# Patient Record
Sex: Female | Born: 1950 | Race: Asian | Hispanic: No | Marital: Married | Smoking: Never smoker
Health system: Southern US, Community
[De-identification: ages and names within clinical notes are randomized; demographics above are authoritative.]

## PROBLEM LIST (undated history)

## (undated) DIAGNOSIS — Z789 Other specified health status: Secondary | ICD-10-CM

## (undated) HISTORY — DX: Other specified health status: Z78.9

## (undated) HISTORY — PX: OTHER SURGICAL HISTORY: SHX169

---

## 2015-04-18 ENCOUNTER — Other Ambulatory Visit: Payer: Self-pay | Admitting: Physician Assistant

## 2015-04-18 ENCOUNTER — Ambulatory Visit
Admission: RE | Admit: 2015-04-18 | Discharge: 2015-04-18 | Disposition: A | Discharge status: Home / Self Care | Payer: BLUE CROSS/BLUE SHIELD | Source: Ambulatory Visit | Attending: Physician Assistant | Admitting: Physician Assistant

## 2015-04-18 DIAGNOSIS — M25562 Pain in left knee: Principal | ICD-10-CM

## 2015-04-18 DIAGNOSIS — M25571 Pain in right ankle and joints of right foot: Secondary | ICD-10-CM | POA: Diagnosis not present

## 2015-04-18 DIAGNOSIS — M25561 Pain in right knee: Secondary | ICD-10-CM

## 2015-04-18 DIAGNOSIS — M25572 Pain in left ankle and joints of left foot: Secondary | ICD-10-CM

## 2015-04-18 DIAGNOSIS — M8580 Other specified disorders of bone density and structure, unspecified site: Secondary | ICD-10-CM | POA: Insufficient documentation

## 2015-04-18 DIAGNOSIS — X58XXXA Exposure to other specified factors, initial encounter: Secondary | ICD-10-CM | POA: Insufficient documentation

## 2015-04-18 DIAGNOSIS — M7989 Other specified soft tissue disorders: Secondary | ICD-10-CM | POA: Diagnosis not present

## 2015-04-18 DIAGNOSIS — S8265XA Nondisplaced fracture of lateral malleolus of left fibula, initial encounter for closed fracture: Secondary | ICD-10-CM | POA: Diagnosis not present

## 2015-04-18 DIAGNOSIS — S8255XA Nondisplaced fracture of medial malleolus of left tibia, initial encounter for closed fracture: Secondary | ICD-10-CM | POA: Diagnosis not present

## 2017-05-28 IMAGING — CR DG ANKLE COMPLETE 3+V*L*
1 series · 3 of 3 positions shown · non-contrast
Comparison: No prior.

CLINICAL DATA: Knee pain.  No reported injury .

EXAM:
LEFT ANKLE COMPLETE - 3+ VIEW

[Series 1: dg ankle complete left · 0.14mm/px · 3 of 3 slices shown]
[im 1/3]
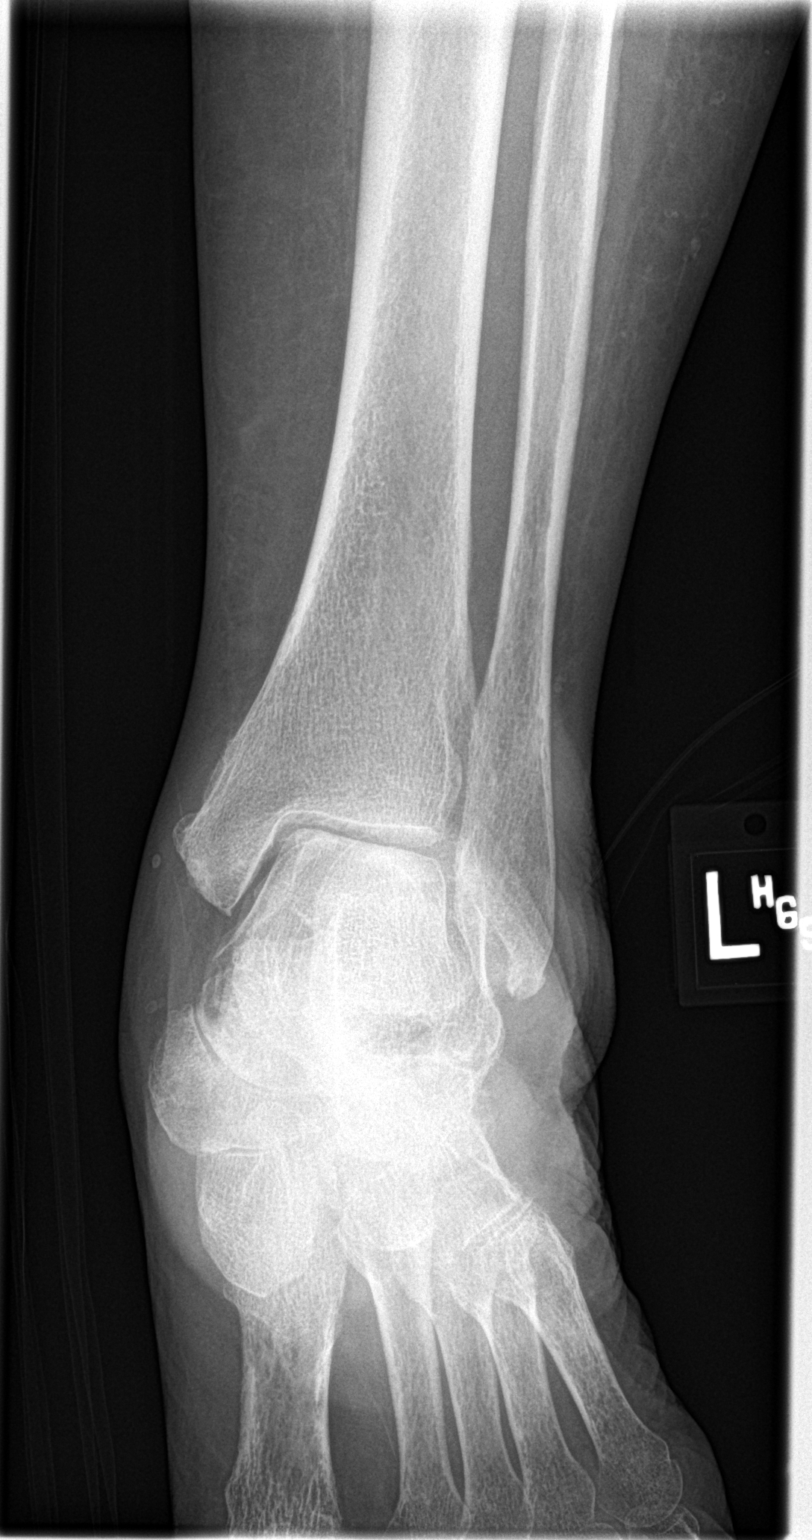
[im 2/3]
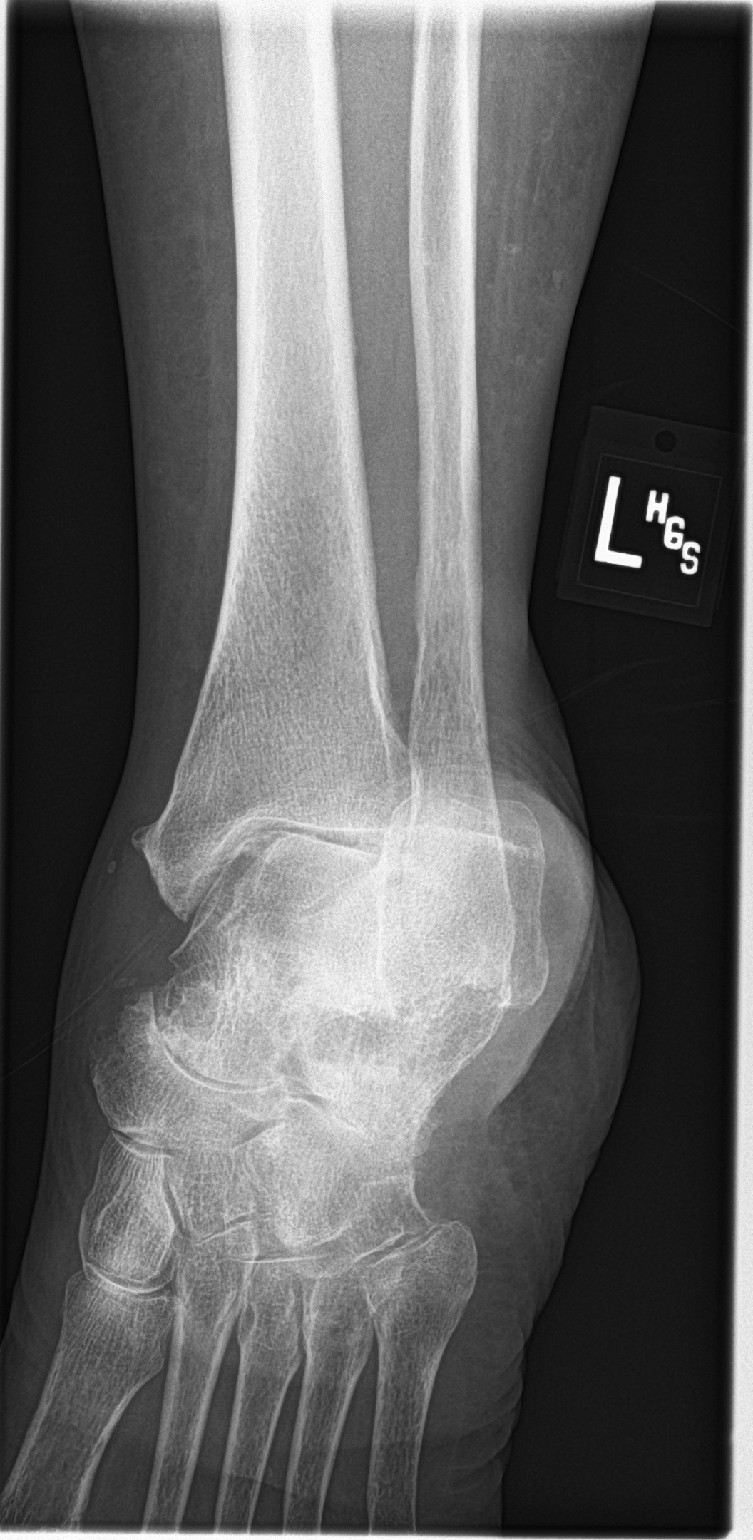
[im 3/3]
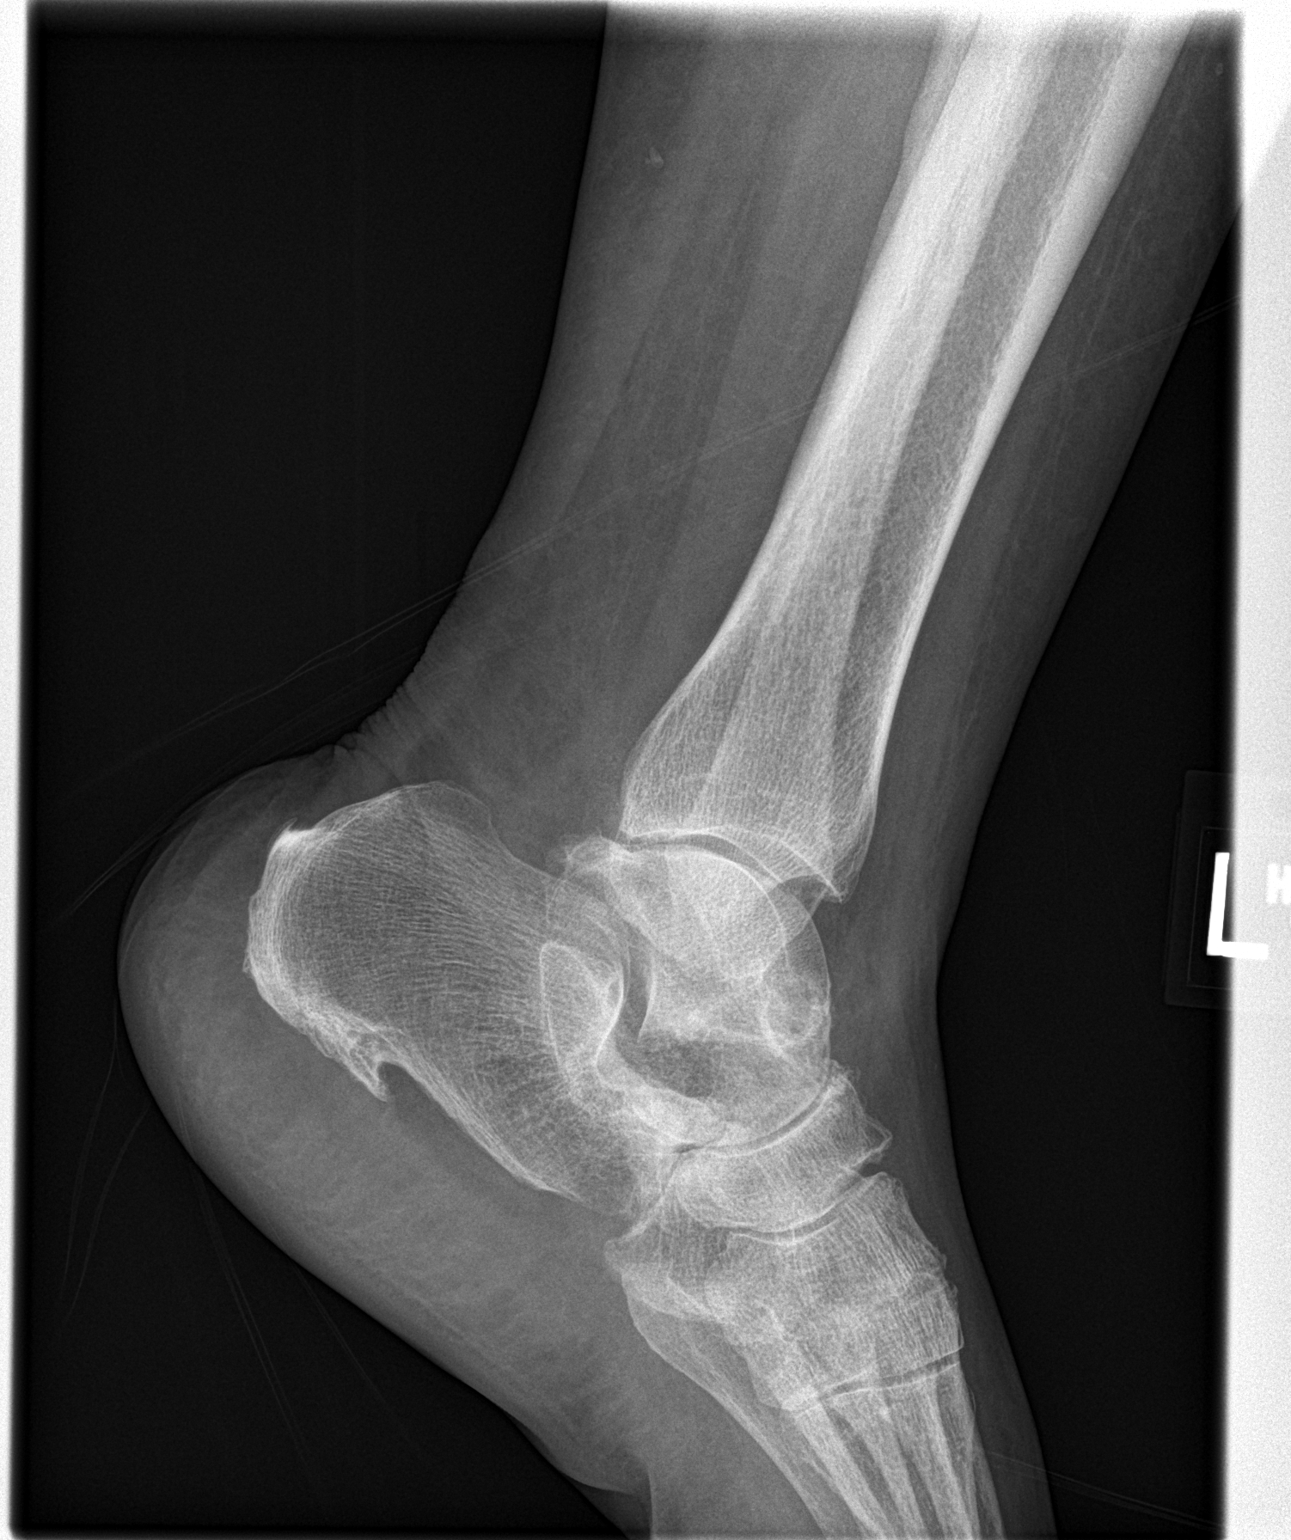

[3 of 3 positions shown; findings below may reference images not displayed]

FINDINGS: Diffuse soft tissue swelling. Diffuse osteopenia degenerative
change. Subtle nondisplaced small avulsion fractures from the distal
tips of these medial and lateral malleoli. Age undetermined. Venous
calcifications noted.
IMPRESSION: Diffuse osteopenia degenerative change. Nondisplaced tiny avulsion
fractures from the distal tips of the medial and lateral malleoli.
Age undetermined.

## 2017-05-28 IMAGING — CR DG ANKLE COMPLETE 3+V*R*
1 series · 3 of 3 positions shown · non-contrast
Comparison: None.

CLINICAL DATA: Bilateral ankle pain for 2 years

EXAM:
RIGHT ANKLE - COMPLETE 3+ VIEW

[Series 1: dg ankle complete right · 0.14mm/px · 3 of 3 slices shown]
[im 1/3]
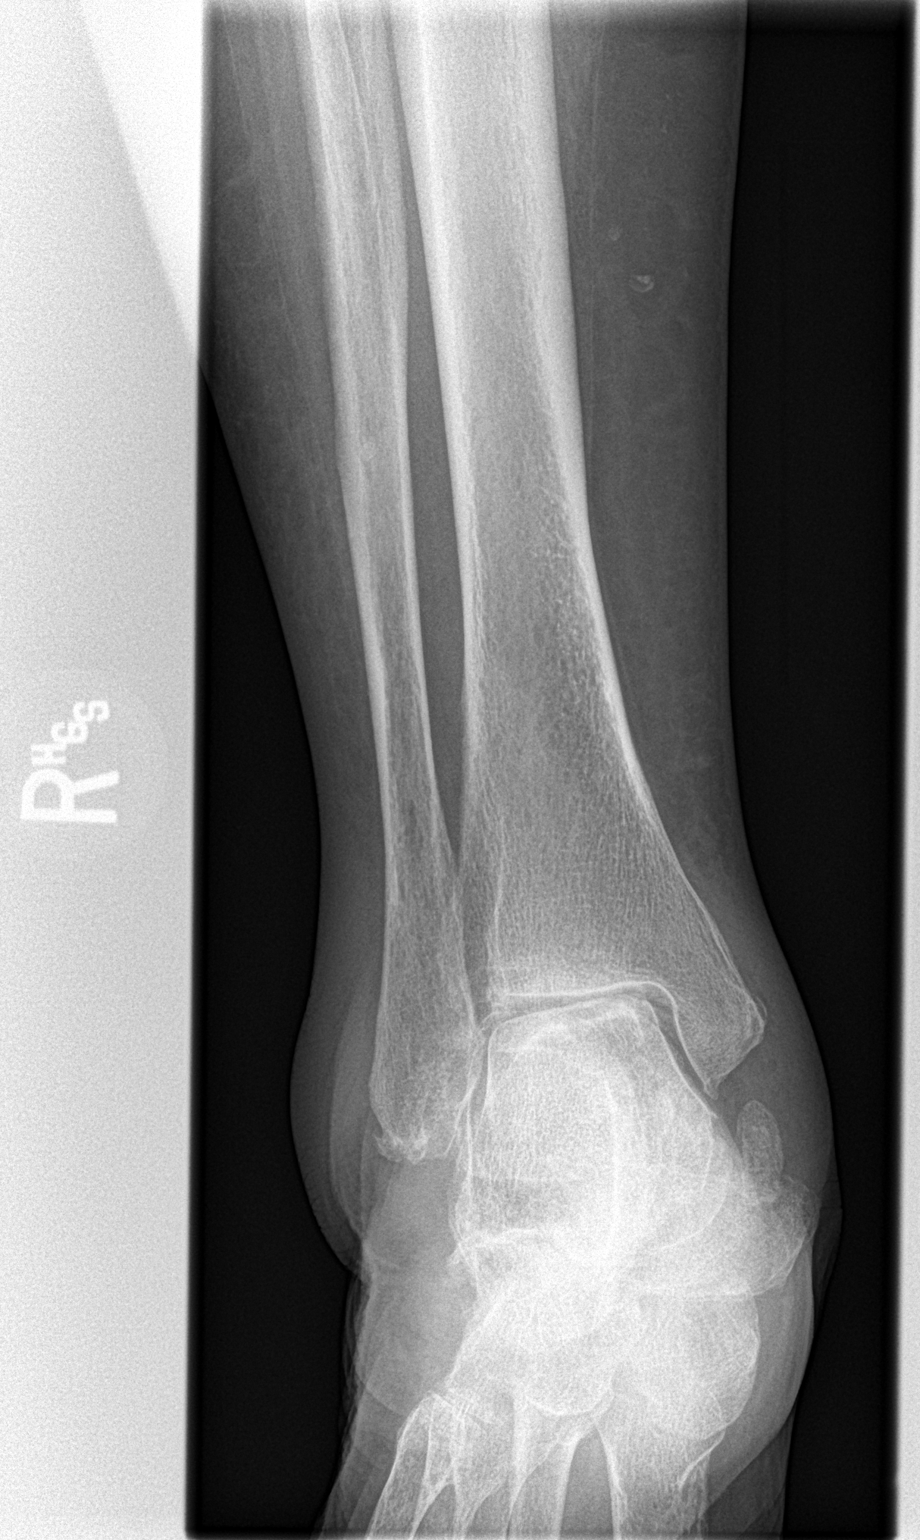
[im 2/3]
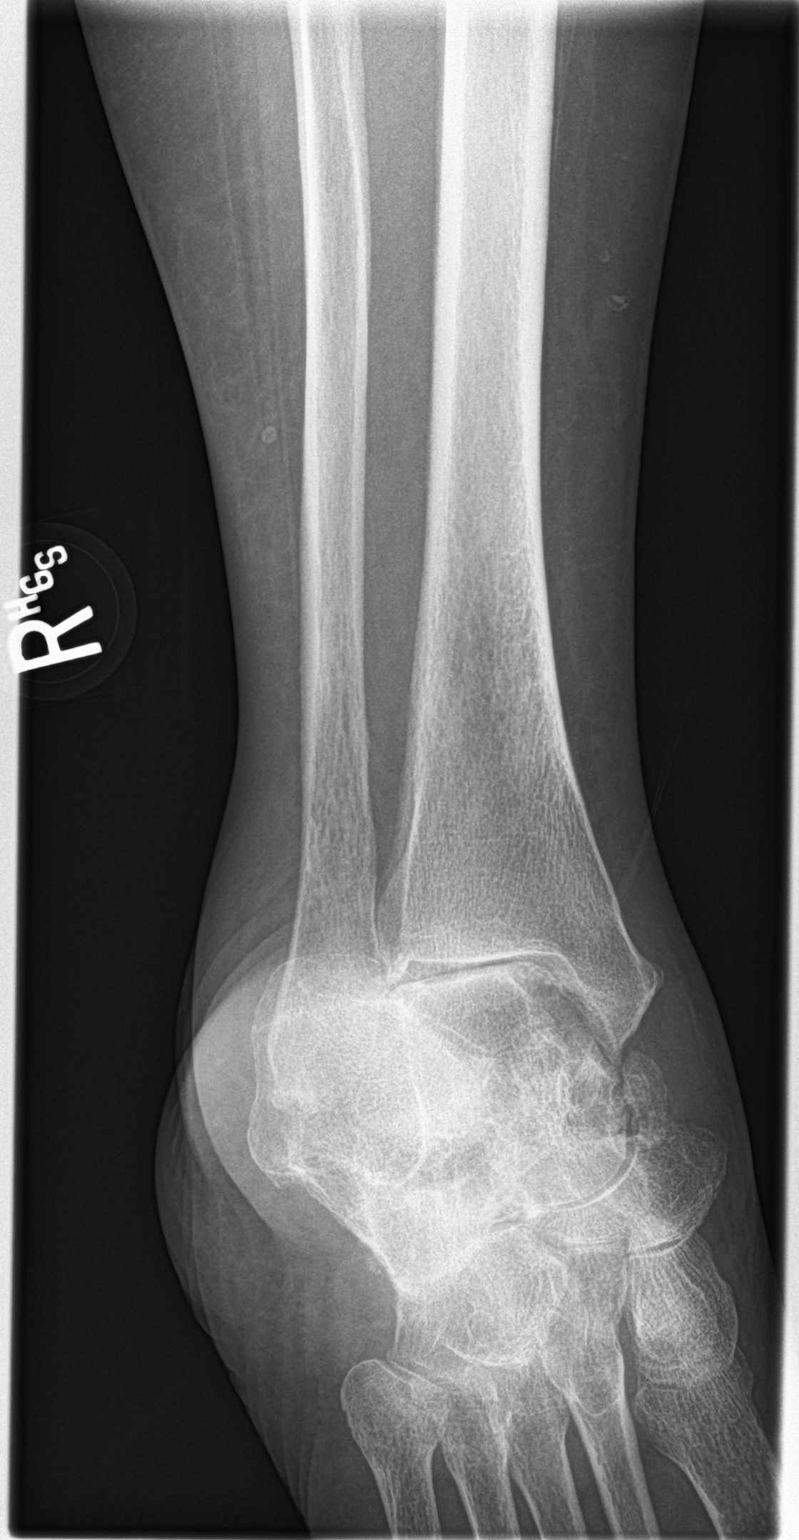
[im 3/3]
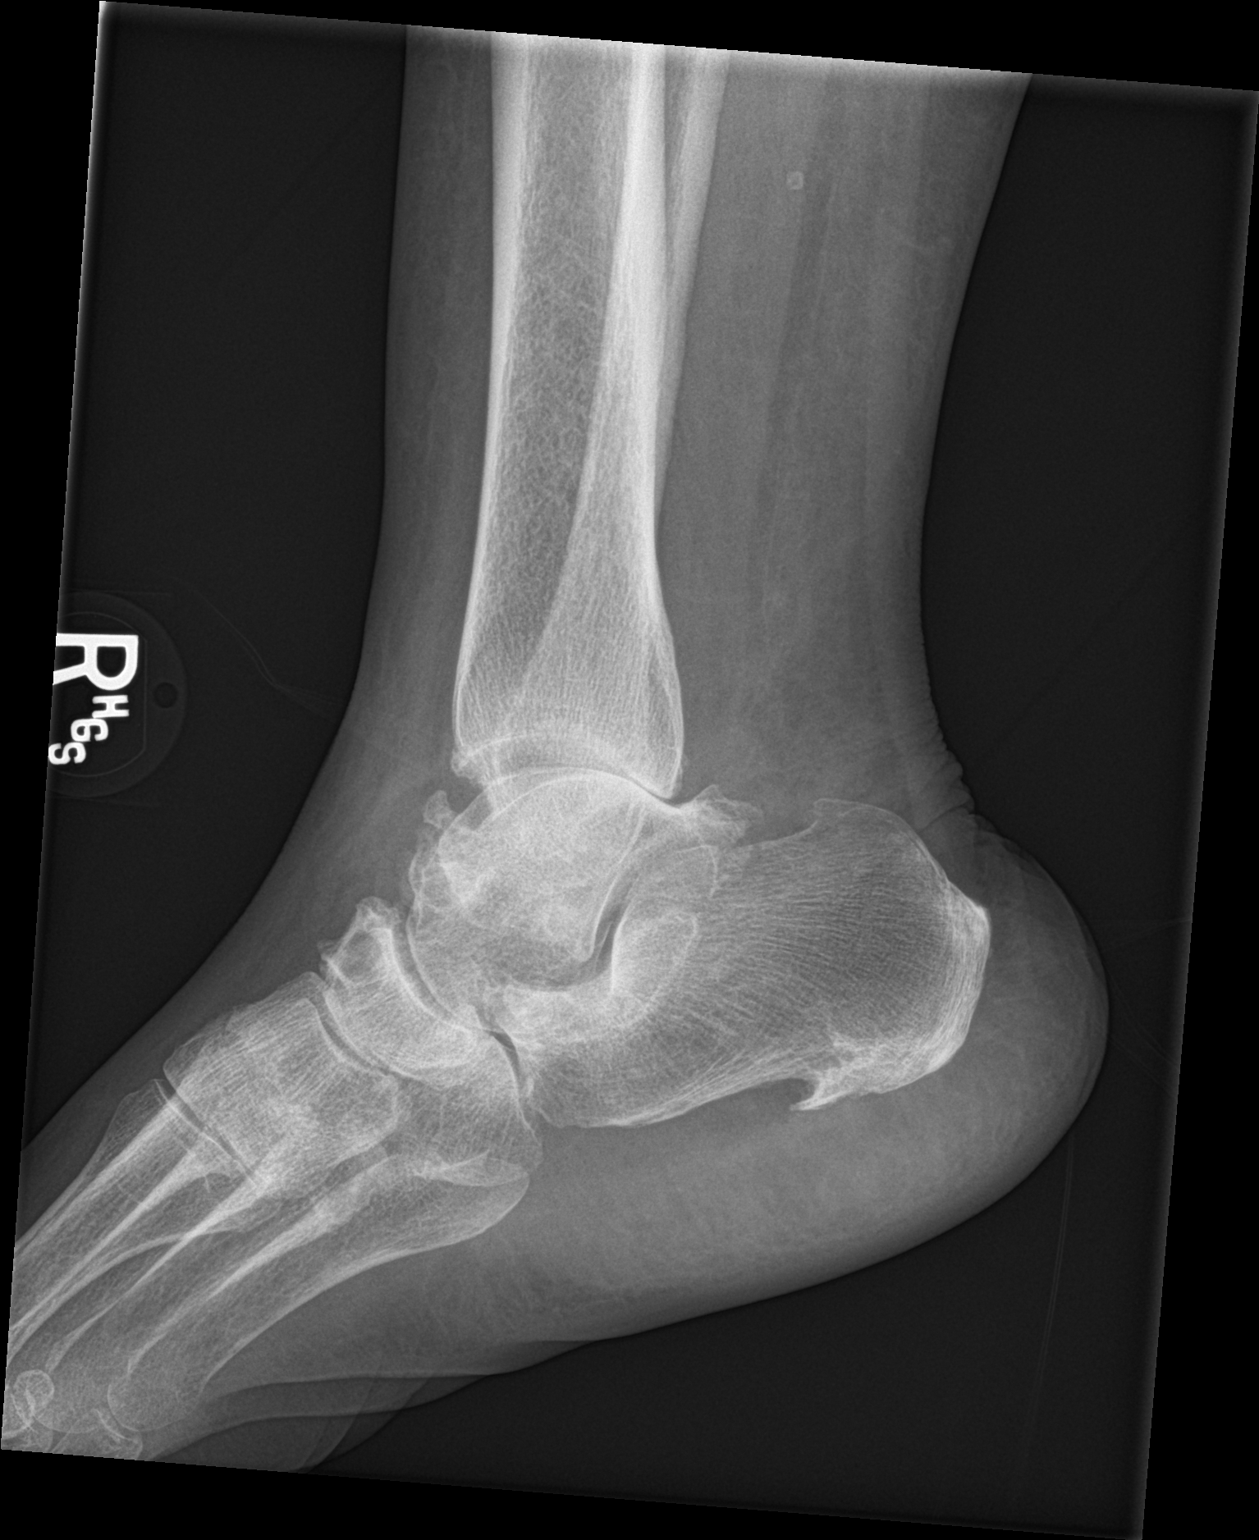

[3 of 3 positions shown; findings below may reference images not displayed]

FINDINGS: Three views of the right ankle submitted. There is diffuse soft
tissue swelling around the ankle. Ankle mortise is preserved. There
is spurring of distal fibula. Mild spurring of distal tibia. Plantar
spur of calcaneus. Dorsal spurring anterior aspect of the talus and
navicular.
IMPRESSION: No acute fracture or subluxation. Diffuse soft tissue swelling.
Degenerative changes.

## 2018-09-05 ENCOUNTER — Other Ambulatory Visit: Payer: Self-pay

## 2018-09-05 ENCOUNTER — Ambulatory Visit: Payer: Medicare Other | Admitting: Internal Medicine

## 2018-09-05 ENCOUNTER — Encounter: Payer: Self-pay | Admitting: Internal Medicine

## 2018-09-05 DIAGNOSIS — E559 Vitamin D deficiency, unspecified: Secondary | ICD-10-CM

## 2018-09-05 DIAGNOSIS — E039 Hypothyroidism, unspecified: Secondary | ICD-10-CM

## 2018-09-05 DIAGNOSIS — E1169 Type 2 diabetes mellitus with other specified complication: Secondary | ICD-10-CM

## 2018-09-05 DIAGNOSIS — R5383 Other fatigue: Secondary | ICD-10-CM | POA: Diagnosis not present

## 2018-09-05 DIAGNOSIS — M25571 Pain in right ankle and joints of right foot: Secondary | ICD-10-CM | POA: Diagnosis not present

## 2018-09-05 DIAGNOSIS — Z7689 Persons encountering health services in other specified circumstances: Secondary | ICD-10-CM

## 2018-09-05 DIAGNOSIS — M25572 Pain in left ankle and joints of left foot: Secondary | ICD-10-CM

## 2018-09-05 DIAGNOSIS — E785 Hyperlipidemia, unspecified: Secondary | ICD-10-CM

## 2018-09-05 DIAGNOSIS — M8589 Other specified disorders of bone density and structure, multiple sites: Secondary | ICD-10-CM | POA: Diagnosis not present

## 2018-09-05 DIAGNOSIS — E538 Deficiency of other specified B group vitamins: Secondary | ICD-10-CM

## 2018-09-05 NOTE — Progress Notes (Signed)
[facility] [place], [place] [address]  Internal MEDICINE  Office Visit Note  Patient Name: Alexis Mcpherson  [id number]  [id number]  Date of Service: 09/11/2018   Complaints/HPI Pt is here for establishment of PCP. Chief Complaint  Patient presents with  . Foot Pain    pain around ankles, swelling on the right foot  . Knee Pain    when standing for too long   HPI 68 yr old woman presents with bilateral ankle pain of 6/10  and swelling x 4 years. It is worse on her right ankle , which makes it difficult to ambulate especially at work. Pt works at [employer] which requires long hours of standing. She has tried dr. Felicie Morn orthopedic foot  insert and an ankle brace both of which did not help reduce her pain.   She also reports gait instability due to her R. Ankle pain  She has no hx of prior fall.  She denies joint pain on other parts on her body.  She denies fever, rash, heat/ cold intolerance.    Current Medication: No outpatient encounter medications on file as of 09/05/2018.   No facility-administered encounter medications on file as of 09/05/2018.     Surgical History: History reviewed. No pertinent surgical history.  Medical History: History reviewed. No pertinent past medical history.  Family History: History reviewed. No pertinent family history.  Social History   Socioeconomic History  . Marital status: Married    Spouse name: Not on file  . Number of children: Not on file  . Years of education: Not on file  . Highest education level: Not on file  Occupational History  . Not on file  Social Needs  . Financial resource strain: Not on file  . Food insecurity    Worry: Not on file    Inability: Not on file  . Transportation needs    Medical: Not on file    Non-medical: Not on file  Tobacco Use  . Smoking status: Never Smoker  . Smokeless tobacco: Never Used  Substance and Sexual Activity  . Alcohol use: Not Currently   . Drug use: Not Currently  . Sexual activity: Not on file  Lifestyle  . Physical activity    Days per week: Not on file    Minutes per session: Not on file  . Stress: Not on file  Relationships  . Social Herbalist on phone: Not on file    Gets together: Not on file    Attends religious service: Not on file    Active member of club or organization: Not on file    Attends meetings of clubs or organizations: Not on file    Relationship status: Not on file  . Intimate partner violence    Fear of current or ex partner: Not on file    Emotionally abused: Not on file    Physically abused: Not on file    Forced sexual activity: Not on file  Other Topics Concern  . Not on file  Social History Narrative  . Not on file   Review of Systems  Constitutional: Negative for chills, diaphoresis and fatigue.  HENT: Negative for ear pain, postnasal drip and sinus pressure.   Eyes: Negative for photophobia, discharge, redness, itching and visual disturbance.  Respiratory: Negative for cough, shortness of breath and wheezing.   Cardiovascular: Negative for chest pain, palpitations and leg swelling.  Gastrointestinal: Negative for abdominal pain, constipation, diarrhea, nausea  and vomiting.  Genitourinary: Negative for dysuria and flank pain.  Musculoskeletal: Positive for arthralgias and joint swelling. Negative for back pain, gait problem and neck pain.       Ankle pain, deformity   Skin: Negative for color change.  Allergic/Immunologic: Negative for environmental allergies and food allergies.  Neurological: Negative for dizziness and headaches.  Hematological: Does not bruise/bleed easily.  Psychiatric/Behavioral: Negative for agitation, behavioral problems (depression) and hallucinations.    Vital Signs: BP 132/67   Pulse 65   Resp 16   Wt 223 lb 6.4 oz (101.3 kg)   SpO2 98%    Physical Exam Constitutional:      Appearance: Normal appearance.  HENT:     Head:  Normocephalic and atraumatic.     Mouth/Throat:     Mouth: Mucous membranes are moist.  Eyes:     Extraocular Movements: Extraocular movements intact.     Pupils: Pupils are equal, round, and reactive to light.  Cardiovascular:     Rate and Rhythm: Normal rate and regular rhythm.     Pulses: Normal pulses.     Heart sounds: Normal heart sounds.  Abdominal:     General: Abdomen is flat.     Palpations: Abdomen is soft.  Musculoskeletal:        General: Swelling, tenderness and deformity present.  Neurological:     General: No focal deficit present.     Mental Status: She is alert and oriented to person, place, and time.  Psychiatric:        Mood and Affect: Mood normal.        Behavior: Behavior normal.        Thought Content: Thought content normal.    Assessment/Plan: 1. Establishing care with new doctor, encounter for - CBC with Differential/Platelet  2. Bilateral ankle pain, unspecified chronicity - Vitamin D 1,25 dihydroxy - Sed Rate (ESR) - DG Bone Density; Future - Ambulatory referral to Orthopedic Surgery  3. Fatigue, unspecified type - Labs ordered   4. Osteopenia of multiple sites - Bone density ordered   5. Hyperlipidemia associated with type 2 diabetes mellitus ([place]) - Lipid Panel With LDL/HDL Ratio  6. Hypothyroidism, unspecified type - T4, free - TSH  7. Vitamin D deficiency - Vitamin D 1,25 dihydroxy, deficiency   8. B12 deficiency - B12 and Folate Panel  General Counseling: Tyriana verbalizes understanding of the findings of todays visit and agrees with plan of treatment. I have discussed any further diagnostic evaluation that may be needed or ordered today. We also reviewed her medications today. she has been encouraged to call the office with any questions or concerns that should arise related to todays visit.   Orders Placed This Encounter  Procedures  . DG Bone Density  . CBC with Differential/Platelet  . Lipid Panel With LDL/HDL Ratio   . TSH  . T4, free  . Comprehensive metabolic panel  . B12 and Folate Panel  . Ferritin  . Vitamin D 1,25 dihydroxy  . Sed Rate (ESR)  . CBC with Differential/Platelet  . Lipid Panel With LDL/HDL Ratio  . T4, free  . TSH  . Ferritin  . Ambulatory referral to Orthopedic Surgery     Time spent:25 Minutes

## 2018-09-09 LAB — TSH: TSH: 4.78 u[IU]/mL — ABNORMAL HIGH (ref 0.450–4.500)

## 2018-09-09 LAB — VITAMIN D 1,25 DIHYDROXY
Vitamin D 1, 25 (OH)2 Total: 65 pg/mL
Vitamin D2 1, 25 (OH)2: <10 pg/mL
Vitamin D3 1, 25 (OH)2: 65 pg/mL

## 2018-09-09 LAB — CBC WITH DIFFERENTIAL/PLATELET
Basophils Absolute: 0.1 10*3/uL (ref 0.0–0.2)
Basos: 1 %
EOS (ABSOLUTE): 0.1 10*3/uL (ref 0.0–0.4)
Eos: 1 %
Hematocrit: 36.9 % (ref 34.0–46.6)
Hemoglobin: 12.1 g/dL (ref 11.1–15.9)
Immature Grans (Abs): 0 10*3/uL (ref 0.0–0.1)
Immature Granulocytes: 0 %
Lymphocytes Absolute: 2.9 10*3/uL (ref 0.7–3.1)
Lymphs: 34 %
MCH: 29.7 pg (ref 26.6–33.0)
MCHC: 32.8 g/dL (ref 31.5–35.7)
MCV: 91 fL (ref 79–97)
Monocytes Absolute: 0.6 10*3/uL (ref 0.1–0.9)
Monocytes: 7 %
Neutrophils Absolute: 5 10*3/uL (ref 1.4–7.0)
Neutrophils: 57 %
Platelets: 234 10*3/uL (ref 150–450)
RBC: 4.07 x10E6/uL (ref 3.77–5.28)
RDW: 12.4 % (ref 11.7–15.4)
WBC: 8.7 10*3/uL (ref 3.4–10.8)

## 2018-09-09 LAB — COMPREHENSIVE METABOLIC PANEL
ALT: 14 IU/L (ref 0–32)
AST: 18 IU/L (ref 0–40)
Albumin/Globulin Ratio: 1.4 (ref 1.2–2.2)
Albumin: 3.8 g/dL (ref 3.8–4.8)
Alkaline Phosphatase: 95 IU/L (ref 39–117)
BUN/Creatinine Ratio: 22 (ref 12–28)
BUN: 16 mg/dL (ref 8–27)
Bilirubin Total: 0.5 mg/dL (ref 0.0–1.2)
CO2: 24 mmol/L (ref 20–29)
Calcium: 9.4 mg/dL (ref 8.7–10.3)
Chloride: 102 mmol/L (ref 96–106)
Creatinine, Ser: 0.73 mg/dL (ref 0.57–1.00)
GFR calc Af Amer: 99 mL/min/{1.73_m2} (ref >59)
GFR calc non Af Amer: 85 mL/min/{1.73_m2} (ref >59)
Globulin, Total: 2.7 g/dL (ref 1.5–4.5)
Glucose: 97 mg/dL (ref 65–99)
Potassium: 4.6 mmol/L (ref 3.5–5.2)
Sodium: 138 mmol/L (ref 134–144)
Total Protein: 6.5 g/dL (ref 6.0–8.5)

## 2018-09-09 LAB — LIPID PANEL WITH LDL/HDL RATIO
Cholesterol, Total: 183 mg/dL (ref 100–199)
HDL: 36 mg/dL — ABNORMAL LOW (ref >39)
LDL Calculated: 117 mg/dL — ABNORMAL HIGH (ref 0–99)
LDl/HDL Ratio: 3.3 ratio — ABNORMAL HIGH (ref 0.0–3.2)
Triglycerides: 148 mg/dL (ref 0–149)
VLDL Cholesterol Cal: 30 mg/dL (ref 5–40)

## 2018-09-09 LAB — FERRITIN: Ferritin: 47 ng/mL (ref 15–150)

## 2018-09-09 LAB — T4, FREE: Free T4: 1.24 ng/dL (ref 0.82–1.77)

## 2018-09-09 LAB — B12 AND FOLATE PANEL
Folate: 8.2 ng/mL (ref >3.0)
Vitamin B-12: 271 pg/mL (ref 232–1245)

## 2018-09-09 LAB — SEDIMENTATION RATE: Sed Rate: 30 mm/hr (ref 0–40)

## 2018-09-26 ENCOUNTER — Encounter: Payer: Self-pay | Admitting: Internal Medicine

## 2018-09-26 ENCOUNTER — Other Ambulatory Visit: Payer: Self-pay

## 2018-09-26 ENCOUNTER — Ambulatory Visit: Payer: Medicare Other | Admitting: Internal Medicine

## 2018-09-26 DIAGNOSIS — Z1231 Encounter for screening mammogram for malignant neoplasm of breast: Secondary | ICD-10-CM

## 2018-09-26 DIAGNOSIS — M25572 Pain in left ankle and joints of left foot: Secondary | ICD-10-CM

## 2018-09-26 DIAGNOSIS — R7989 Other specified abnormal findings of blood chemistry: Secondary | ICD-10-CM | POA: Diagnosis not present

## 2018-09-26 DIAGNOSIS — M25562 Pain in left knee: Secondary | ICD-10-CM

## 2018-09-26 DIAGNOSIS — M25561 Pain in right knee: Secondary | ICD-10-CM

## 2018-09-26 DIAGNOSIS — M8589 Other specified disorders of bone density and structure, multiple sites: Secondary | ICD-10-CM

## 2018-09-26 DIAGNOSIS — G8929 Other chronic pain: Secondary | ICD-10-CM

## 2018-09-26 DIAGNOSIS — M25571 Pain in right ankle and joints of right foot: Secondary | ICD-10-CM | POA: Diagnosis not present

## 2018-09-26 NOTE — Progress Notes (Signed)
[facility] [place], [place] [address]  Internal MEDICINE  Office Visit Note  Patient Name: Alexis Mcpherson  [id number]  [id number]  Date of Service: 09/27/2018  Chief Complaint  Patient presents with  . Medical Management of Chronic Issues    labs follow up  . Quality Metric Gaps    mammogram,colonoscopy and flu shot and pneumonia    HPI  Pt is here for routine follow up, had labs drawn, Continues to have feet and knee pain and difficulty walking.  Labs are discussed with her, TSH is mildly elevated with normal T4  Current Medication: No outpatient encounter medications on file as of 09/26/2018.   No facility-administered encounter medications on file as of 09/26/2018.     Surgical History: History reviewed. No pertinent surgical history.  Medical History: History reviewed. No pertinent past medical history.  Family History: History reviewed. No pertinent family history.  Social History   Socioeconomic History  . Marital status: Married    Spouse name: Not on file  . Number of children: Not on file  . Years of education: Not on file  . Highest education level: Not on file  Occupational History  . Not on file  Social Needs  . Financial resource strain: Not on file  . Food insecurity    Worry: Not on file    Inability: Not on file  . Transportation needs    Medical: Not on file    Non-medical: Not on file  Tobacco Use  . Smoking status: Never Smoker  . Smokeless tobacco: Never Used  Substance and Sexual Activity  . Alcohol use: Not Currently  . Drug use: Not Currently  . Sexual activity: Not on file  Lifestyle  . Physical activity    Days per week: Not on file    Minutes per session: Not on file  . Stress: Not on file  Relationships  . Social Herbalist on phone: Not on file    Gets together: Not on file    Attends religious service: Not on file    Active member of club or organization: Not on file    Attends  meetings of clubs or organizations: Not on file    Relationship status: Not on file  . Intimate partner violence    Fear of current or ex partner: Not on file    Emotionally abused: Not on file    Physically abused: Not on file    Forced sexual activity: Not on file  Other Topics Concern  . Not on file  Social History Narrative  . Not on file      Review of Systems  Constitutional: Negative for chills, diaphoresis and fatigue.  HENT: Negative for ear pain, postnasal drip and sinus pressure.   Eyes: Negative for photophobia, discharge, redness, itching and visual disturbance.  Respiratory: Negative for cough, shortness of breath and wheezing.   Cardiovascular: Negative for chest pain, palpitations and leg swelling.  Gastrointestinal: Negative for abdominal pain, constipation, diarrhea, nausea and vomiting.  Genitourinary: Negative for dysuria and flank pain.  Musculoskeletal: Negative for arthralgias, back pain, gait problem and neck pain.  Skin: Negative for color change.  Allergic/Immunologic: Negative for environmental allergies and food allergies.  Neurological: Negative for dizziness and headaches.  Hematological: Does not bruise/bleed easily.  Psychiatric/Behavioral: Negative for agitation, behavioral problems (depression) and hallucinations.    Vital Signs: BP 137/68   Pulse 73   Resp 16   Ht 5\' 1"  (1.549  m)   Wt 222 lb (100.7 kg)   SpO2 97%   BMI 41.95 kg/m    Physical Exam Constitutional:      General: She is not in acute distress.    Appearance: She is well-developed. She is not diaphoretic.  HENT:     Head: Normocephalic and atraumatic.     Mouth/Throat:     Pharynx: No oropharyngeal exudate.  Neck:     Musculoskeletal: Normal range of motion and neck supple.     Thyroid: No thyromegaly.     Vascular: No JVD.     Trachea: No tracheal deviation.  Cardiovascular:     Rate and Rhythm: Normal rate and regular rhythm.     Heart sounds: Normal heart  sounds. No murmur. No friction rub. No gallop.   Pulmonary:     Effort: Pulmonary effort is normal. No respiratory distress.     Breath sounds: No wheezing or rales.  Chest:     Chest wall: No tenderness.  Musculoskeletal:        General: Swelling and deformity present.     Comments: Decreased ROM knees, arthritis   Lymphadenopathy:     Cervical: No cervical adenopathy.  Skin:    General: Skin is warm and dry.  Neurological:     General: No focal deficit present.     Mental Status: She is alert.     Cranial Nerves: No cranial nerve deficit.  Psychiatric:        Behavior: Behavior normal.        Judgment: Judgment normal.    Assessment/Plan: 1. Chronic pain of both knees Pt has poor mechanics of her gait with chronic mobility and balance issues due to arthritis, needs evaluation of her posture and gait, might need brace or support , will consult ortho   2. Bilateral ankle pain, unspecified chronicity Due to deformity  3. Elevated TSH Pt has normal Free T4, will monitor for now   4. Visit for screening mammogram - MM DIGITAL SCREENING BILATERAL; Future  5. Osteopenia of multiple sites Bone density scheduled   General Counseling: Katryna verbalizes understanding of the findings of todays visit and agrees with plan of treatment. I have discussed any further diagnostic evaluation that may be needed or ordered today. We also reviewed her medications today. she has been encouraged to call the office with any questions or concerns that should arise related to todays visit.  Orders Placed This Encounter  Procedures  . MM DIGITAL SCREENING BILATERAL     Time spent:25 Minutes  Dr Lyndon CodeFozia M  Internal medicine

## 2019-07-09 DIAGNOSIS — M19071 Primary osteoarthritis, right ankle and foot: Secondary | ICD-10-CM | POA: Insufficient documentation

## 2019-09-27 ENCOUNTER — Telehealth: Payer: Self-pay

## 2019-09-27 NOTE — Telephone Encounter (Signed)
Patient will call back to confirm appt per spouse for 10-01-19.

## 2019-10-01 ENCOUNTER — Ambulatory Visit: Payer: Medicare Other | Admitting: Internal Medicine

## 2019-10-23 ENCOUNTER — Encounter (INDEPENDENT_AMBULATORY_CARE_PROVIDER_SITE_OTHER): Payer: Self-pay

## 2019-10-23 ENCOUNTER — Other Ambulatory Visit: Payer: Self-pay

## 2019-10-23 ENCOUNTER — Ambulatory Visit (INDEPENDENT_AMBULATORY_CARE_PROVIDER_SITE_OTHER): Payer: Medicare Other | Admitting: Internal Medicine

## 2019-10-23 ENCOUNTER — Encounter: Payer: Self-pay | Admitting: Internal Medicine

## 2019-10-23 VITALS — BP 142/80 | HR 56 | Temp 97.9°F | Resp 16 | Ht 63.0 in | Wt 223.4 lb

## 2019-10-23 DIAGNOSIS — R5383 Other fatigue: Secondary | ICD-10-CM | POA: Diagnosis not present

## 2019-10-23 DIAGNOSIS — E2839 Other primary ovarian failure: Secondary | ICD-10-CM

## 2019-10-23 DIAGNOSIS — Z1231 Encounter for screening mammogram for malignant neoplasm of breast: Secondary | ICD-10-CM

## 2019-10-23 DIAGNOSIS — R0989 Other specified symptoms and signs involving the circulatory and respiratory systems: Secondary | ICD-10-CM | POA: Diagnosis not present

## 2019-10-23 DIAGNOSIS — Z6839 Body mass index (BMI) 39.0-39.9, adult: Secondary | ICD-10-CM

## 2019-10-23 DIAGNOSIS — H6123 Impacted cerumen, bilateral: Secondary | ICD-10-CM

## 2019-10-23 DIAGNOSIS — Z0001 Encounter for general adult medical examination with abnormal findings: Secondary | ICD-10-CM

## 2019-10-23 DIAGNOSIS — Z23 Encounter for immunization: Secondary | ICD-10-CM

## 2019-10-23 DIAGNOSIS — R300 Dysuria: Secondary | ICD-10-CM

## 2019-10-23 MED ORDER — DEBROX 6.5 % OT SOLN: 5.0000 [drp] | Freq: Every day | OTIC | 0 refills | Status: DC

## 2019-10-23 NOTE — Progress Notes (Signed)
[facility] [address] [place], [place] [address]  Internal MEDICINE  Office Visit Note  Patient Name: Alexis Mcpherson  [id number]  [id number]  Date of Service: 10/26/2019  Chief Complaint  Patient presents with  . Medicare Wellness  . Quality Metric Gaps    Hep C screen, Tdap, mammogram, dexa scan, flu vaccine  . pain management form    acknowledged     HPI Pt is here for routine health maintenance examination, overall feels well but somewhat more tired than before. She did see ortho for her feet deformity and brace was given to her, however she is not wearing it due to fitting issue. She has not been seen in the office for a year now. TSH was slightly elevated, she aslo did not go for her mammogram and BMD.   Current Medication: No outpatient encounter medications on file as of 10/23/2019.   No facility-administered encounter medications on file as of 10/23/2019.    Surgical History: Past Surgical History:  Procedure Laterality Date  . none      Medical History: Past Medical History:  Diagnosis Date  . Known health problems: none     Family History: Family History  Problem Relation Age of Onset  . Ulcers Mother     Review of Systems  Constitutional: Negative for chills, diaphoresis and fatigue.  HENT: Negative for ear pain, postnasal drip and sinus pressure.   Eyes: Negative for photophobia, discharge, redness, itching and visual disturbance.  Respiratory: Negative for cough, shortness of breath and wheezing.   Cardiovascular: Negative for chest pain, palpitations and leg swelling.  Gastrointestinal: Negative for abdominal pain, constipation, diarrhea, nausea and vomiting.  Genitourinary: Negative for dysuria and flank pain.  Musculoskeletal: Negative for arthralgias, back pain, gait problem and neck pain.  Skin: Negative for color change.  Allergic/Immunologic: Negative for environmental allergies and food allergies.  Neurological: Negative for  dizziness and headaches.  Hematological: Does not bruise/bleed easily.  Psychiatric/Behavioral: Negative for agitation, behavioral problems (depression) and hallucinations.     Vital Signs: BP (!) 142/80   Pulse (!) 56   Temp 97.9 F (36.6 C)   Resp 16   Ht 5\' 3"  (1.6 m)   Wt 223 lb 6.4 oz (101.3 kg)   SpO2 97%   BMI 39.57 kg/m    Physical Exam Constitutional:      General: She is not in acute distress.    Appearance: She is well-developed. She is not diaphoretic.  HENT:     Head: Normocephalic and atraumatic.     Mouth/Throat:     Pharynx: No oropharyngeal exudate.  Eyes:     Pupils: Pupils are equal, round, and reactive to light.  Neck:     Thyroid: No thyromegaly.     Vascular: No JVD.     Trachea: No tracheal deviation.  Cardiovascular:     Rate and Rhythm: Normal rate and regular rhythm.     Heart sounds: Normal heart sounds. No murmur heard.  No friction rub. No gallop.   Pulmonary:     Effort: Pulmonary effort is normal. No respiratory distress.     Breath sounds: No wheezing or rales.  Chest:     Chest wall: No tenderness.     Breasts:        Right: Normal.        Left: Normal.  Abdominal:     General: Bowel sounds are normal.     Palpations: Abdomen is soft.  Musculoskeletal:  General: Normal range of motion.     Cervical back: Normal range of motion and neck supple.  Lymphadenopathy:     Cervical: No cervical adenopathy.  Skin:    General: Skin is warm and dry.  Neurological:     Mental Status: She is alert and oriented to person, place, and time.     Cranial Nerves: No cranial nerve deficit.  Psychiatric:        Behavior: Behavior normal.        Thought Content: Thought content normal.        Judgment: Judgment normal.    Assessment/Plan: 1. Encounter for general adult medical examination with abnormal findings All PHM is updated today according to guidelines, pt was instructed to see ortho for her Brace issues   2. Visit for  screening mammogram - MM DIGITAL SCREENING BILATERAL; Future  3. Other fatigue Will update her labs, her TSH was slightly elevated on last visit  - CBC with Differential/Platelet; Future - TSH; Future - T4, free; Future - Comprehensive metabolic panel  4. Other primary ovarian failure - DG Bone Density; Future - Lipid Panel With LDL/HDL Ratio; Future  5. Flu vaccine need - Flu Vaccine MDCK QUAD PF  6. Dysuria - UA/M w/rflx Culture, Routine - Microscopic Examination - Urine Culture, Reflex  7. Bruit of right carotid artery Pt does have a bruit on the right side, will get vascular study  - US Carotid Bilateral; Future  8. Bilateral impacted cerumen - Ear Lavage  General Counseling: Neely verbalizes understanding of the findings of todays visit and agrees with plan of treatment. I have discussed any further diagnostic evaluation that may be needed or ordered today. We also reviewed her medications today. she has been encouraged to call the office with any questions or concerns that should arise related to todays visit.   Orders Placed This Encounter  Procedures  . Microscopic Examination  . Urine Culture, Reflex  . DG Bone Density  . MM DIGITAL SCREENING BILATERAL  . US Carotid Bilateral  . Flu Vaccine MDCK QUAD PF  . CBC with Differential/Platelet  . Lipid Panel With LDL/HDL Ratio  . TSH  . T4, free  . Comprehensive metabolic panel  . UA/M w/rflx Culture, Routine  . Ear Lavage     Total time spent 35 Minutes  Time spent includes review of chart, medications, test results, and follow up plan with the patient.     Lyndon Code, MD  Internal Medicine

## 2019-10-26 LAB — UA/M W/RFLX CULTURE, ROUTINE
Bilirubin, UA: NEGATIVE
Glucose, UA: NEGATIVE
Ketones, UA: NEGATIVE
Nitrite, UA: NEGATIVE
Protein,UA: NEGATIVE
RBC, UA: NEGATIVE
Specific Gravity, UA: 1.021 (ref 1.005–1.030)
Urobilinogen, Ur: 0.2 mg/dL (ref 0.2–1.0)
pH, UA: 5 (ref 5.0–7.5)

## 2019-10-26 LAB — MICROSCOPIC EXAMINATION
Casts: NONE SEEN /lpf
Epithelial Cells (non renal): >10 /hpf — AB (ref 0–10)
RBC: NONE SEEN /hpf (ref 0–2)

## 2019-10-26 LAB — URINE CULTURE, REFLEX

## 2019-10-31 ENCOUNTER — Other Ambulatory Visit: Payer: Self-pay

## 2019-10-31 ENCOUNTER — Ambulatory Visit (INDEPENDENT_AMBULATORY_CARE_PROVIDER_SITE_OTHER): Payer: Medicare Other

## 2019-10-31 ENCOUNTER — Ambulatory Visit: Payer: Medicare Other

## 2019-10-31 DIAGNOSIS — H6121 Impacted cerumen, right ear: Secondary | ICD-10-CM

## 2019-10-31 DIAGNOSIS — R0989 Other specified symptoms and signs involving the circulatory and respiratory systems: Secondary | ICD-10-CM

## 2019-11-01 LAB — COMPREHENSIVE METABOLIC PANEL
ALT: 17 IU/L (ref 0–32)
AST: 19 IU/L (ref 0–40)
Albumin/Globulin Ratio: 1.6 (ref 1.2–2.2)
Albumin: 4.1 g/dL (ref 3.8–4.8)
Alkaline Phosphatase: 104 IU/L (ref 44–121)
BUN/Creatinine Ratio: 22 (ref 12–28)
BUN: 14 mg/dL (ref 8–27)
Bilirubin Total: 0.6 mg/dL (ref 0.0–1.2)
CO2: 24 mmol/L (ref 20–29)
Calcium: 9.3 mg/dL (ref 8.7–10.3)
Chloride: 104 mmol/L (ref 96–106)
Creatinine, Ser: 0.64 mg/dL (ref 0.57–1.00)
GFR calc Af Amer: 106 mL/min/{1.73_m2} (ref >59)
GFR calc non Af Amer: 92 mL/min/{1.73_m2} (ref >59)
Globulin, Total: 2.5 g/dL (ref 1.5–4.5)
Glucose: 105 mg/dL — ABNORMAL HIGH (ref 65–99)
Potassium: 4.5 mmol/L (ref 3.5–5.2)
Sodium: 140 mmol/L (ref 134–144)
Total Protein: 6.6 g/dL (ref 6.0–8.5)

## 2019-11-01 LAB — MICROSCOPIC EXAMINATION
Bacteria, UA: NONE SEEN
Casts: NONE SEEN /lpf
RBC: NONE SEEN /hpf (ref 0–2)

## 2019-11-01 LAB — UA/M W/RFLX CULTURE, ROUTINE
Bilirubin, UA: NEGATIVE
Glucose, UA: NEGATIVE
Ketones, UA: NEGATIVE
Leukocytes,UA: NEGATIVE
Nitrite, UA: NEGATIVE
Protein,UA: NEGATIVE
RBC, UA: NEGATIVE
Specific Gravity, UA: 1.019 (ref 1.005–1.030)
Urobilinogen, Ur: 0.2 mg/dL (ref 0.2–1.0)
pH, UA: 5.5 (ref 5.0–7.5)

## 2019-11-01 NOTE — Progress Notes (Signed)
Pt came for ear irrigation.  Irrigated right ear.  dbs

## 2019-11-02 NOTE — Procedures (Signed)
[facility] [address] [place], [place] [address]  DATE OF SERVICE: October 31, 2019  CAROTID DOPPLER INTERPRETATION:  Bilateral Carotid Ultrsasound and Color Doppler Examination was performed. The RIGHT CCA shows minimal plaque in the vessel. The LEFT CCA shows minimal plaque in the vessel. There was no significant intimal thickening noted in the RIGHT carotid artery. There was no significant intimal thickening in the LEFT carotid artery.  The RIGHT CCA shows peak systolic velocity of 71 cm per second. The end diastolic velocity is 16 cm per second on the RIGHT side. The RIGHT ICA shows peak systolic velocity of 49 per second. RIGHT sided ICA end diastolic velocity is 19 cm per second. The RIGHT ECA shows a peak systolic velocity of 70 cm per second. The ICA/CCA ratio is calculated to be 0.7. This suggests less than 50% stenosis. The Vertebral Artery shows antegrade flow.  The LEFT CCA shows peak systolic velocity of 80 cm per second. The end diastolic velocity is 16 cm per second on the LEFT side. The LEFT ICA shows peak systolic velocity of 51 per second. LEFT sided ICA end diastolic velocity is 20 cm per second. The LEFT ECA shows a peak systolic velocity of 41 cm per second. The ICA/CCA ratio is calculated to be 0.6. This suggests less than 50% stenosis. The Vertebral Artery shows antegrade flow.   Impression:    The RIGHT CAROTID shows less than 50% stenosis. The LEFT CAROTID shows less than 50% stenosis.  There is minimal plaque formation noted on the LEFT and minimal plaque on the RIGHT  side. Consider a repeat Carotid doppler if clinical situation and symptoms warrant in 6-12 months. Patient should be encouraged to change lifestyles such as smoking cessation, regular exercise and dietary modification. Use of statins in the right clinical setting and ASA is encouraged.  Yevonne Pax, MD [facility] Pulmonary Critical Care Medicine

## 2019-11-06 NOTE — Progress Notes (Signed)
Results will be discussed on next visit

## 2019-11-15 ENCOUNTER — Ambulatory Visit: Payer: Medicare Other | Admitting: Internal Medicine

## 2019-11-15 ENCOUNTER — Encounter: Payer: Self-pay | Admitting: Internal Medicine

## 2019-11-15 ENCOUNTER — Other Ambulatory Visit: Payer: Self-pay

## 2019-11-15 VITALS — BP 142/80 | HR 62 | Temp 97.8°F | Resp 16 | Ht 63.0 in | Wt 224.0 lb

## 2019-11-15 DIAGNOSIS — M2141 Flat foot [pes planus] (acquired), right foot: Secondary | ICD-10-CM

## 2019-11-15 DIAGNOSIS — I10 Essential (primary) hypertension: Secondary | ICD-10-CM

## 2019-11-15 DIAGNOSIS — N393 Stress incontinence (female) (male): Secondary | ICD-10-CM

## 2019-11-15 DIAGNOSIS — I6523 Occlusion and stenosis of bilateral carotid arteries: Secondary | ICD-10-CM

## 2019-11-15 MED ORDER — TRIAMTERENE-HCTZ 37.5-25 MG PO TABS: 1.0000 | ORAL_TABLET | Freq: Every day | ORAL | 3 refills | Status: DC

## 2019-11-15 MED ORDER — OXYBUTYNIN CHLORIDE ER 10 MG PO TB24: 10.0000 mg | ORAL_TABLET | Freq: Every day | ORAL | 6 refills | Status: DC

## 2019-11-15 NOTE — Progress Notes (Signed)
[facility] [address] [place], [place] [address]  Internal MEDICINE  Office Visit Note  Patient Name: Alexis Mcpherson  [id number]  [id number]  Date of Service: 11/22/2019  Chief Complaint  Patient presents with  . Follow-up    u/s and labs  . policy update form  . Quality Metric Gaps    mammogram, dexa    HPI Pt is here for routine follow after CPE and labs. She feels well. BP continues to be elevated. She is c/o urine urgency and leakage at times, inability to control. Bothers her that she cannot go out safely. 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 oxybutynin (DITROPAN XL) 10 MG 24 hr tablet Take 1 tablet (10 mg total) by mouth at bedtime.  . triamterene-hydrochlorothiazide (MAXZIDE-25) 37.5-25 MG tablet Take 1 tablet by mouth daily.  . [DISCONTINUED] carbamide peroxide (DEBROX) 6.5 % OTIC solution Place 5 drops into both ears daily. (Patient not taking: Reported on 11/15/2019)   No facility-administered encounter medications on file as of 11/15/2019.    Surgical History: Past Surgical History:  Procedure Laterality Date  . none      Medical History: Past Medical History:  Diagnosis Date  . Known health problems: none     Family History: Family History  Problem Relation Age of Onset  . Ulcers Mother     Social History   Socioeconomic History  . Marital status: Married    Spouse name: Not on file  . Number of children: Not on file  . Years of education: Not on file  . Highest education level: Not on file  Occupational History  . Not on file  Tobacco Use  . Smoking status: Never Smoker  . Smokeless tobacco: Never Used  Substance and Sexual Activity  . Alcohol use: Not  Currently  . Drug use: Not Currently  . Sexual activity: Not on file  Other Topics Concern  . Not on file  Social History Narrative  . Not on file   Social Determinants of Health   Financial Resource Strain:   . Difficulty of Paying Living Expenses: Not on file  Food Insecurity:   . Worried About Programme researcher, broadcasting/film/video in the Last Year: Not on file  . Ran Out of Food in the Last Year: Not on file  Transportation Needs:   . Lack of Transportation (Medical): Not on file  . Lack of Transportation (Non-Medical): Not on file  Physical Activity:   . Days of Exercise per Week: Not on file  . Minutes of Exercise per Session: Not on file  Stress:   . Feeling of Stress : Not on file  Social Connections:   . Frequency of Communication with Friends and Family: Not on file  . Frequency of Social Gatherings with Friends and Family: Not on file  . Attends Religious Services: Not on file  . Active Member of Clubs or Organizations: Not on file  . Attends Banker Meetings: Not on file  . Marital Status: Not on file  Intimate Partner Violence:   . Fear of Current or Ex-Partner: Not on file  . Emotionally Abused: Not on file  . Physically Abused: Not on file  . Sexually Abused: Not on  file      Review of Systems  Constitutional: Negative for chills, diaphoresis and fatigue.  HENT: Negative for ear pain, postnasal drip and sinus pressure.   Eyes: Negative for photophobia, discharge, redness, itching and visual disturbance.  Respiratory: Negative for cough, shortness of breath and wheezing.   Cardiovascular: Negative for chest pain, palpitations and leg swelling.  Gastrointestinal: Negative for abdominal pain, constipation, diarrhea, nausea and vomiting.  Genitourinary: Negative for dysuria and flank pain.  Musculoskeletal: Negative for arthralgias, back pain, gait problem and neck pain.  Skin: Negative for color change.  Allergic/Immunologic: Negative for environmental allergies  and food allergies.  Neurological: Negative for dizziness and headaches.  Hematological: Does not bruise/bleed easily.  Psychiatric/Behavioral: Negative for agitation, behavioral problems (depression) and hallucinations.    Vital Signs: BP (!) 142/80   Pulse 62   Temp 97.8 F (36.6 C)   Resp 16   Ht 5\' 3"  (1.6 m)   Wt 224 lb (101.6 kg)   SpO2 99%   BMI 39.68 kg/m    Physical Exam Constitutional:      Appearance: She is obese.  HENT:     Head: Normocephalic and atraumatic.  Eyes:     Extraocular Movements: Extraocular movements intact.     Pupils: Pupils are equal, round, and reactive to light.  Cardiovascular:     Rate and Rhythm: Normal rate and regular rhythm.     Pulses: Normal pulses.     Heart sounds: Normal heart sounds.  Pulmonary:     Effort: Pulmonary effort is normal.     Breath sounds: Normal breath sounds.  Musculoskeletal:        General: Deformity present.     Comments: Feet deformity  Skin:    General: Skin is warm and dry.  Neurological:     General: No focal deficit present.     Mental Status: She is alert.      Assessment/Plan: 1. Essential hypertension, benign DASH diet, will start low dose triam/hctz - triamterene-hydrochlorothiazide (MAXZIDE-25) 37.5-25 MG tablet; Take 1 tablet by mouth daily.  Dispense: 90 tablet; Refill: 3  2. Pes planovalgus, acquired, right Per Ortho  3. Stress incontinence (female) (female) Start Ditropan xl  - oxybutynin (DITROPAN XL) 10 MG 24 hr tablet; Take 1 tablet (10 mg total) by mouth at bedtime.  Dispense: 30 tablet; Refill: 6  4. Atherosclerosis of both carotid arteries Mild plaque formation, pt wants to wait until next visit    General Counseling: Kaylany verbalizes understanding of the findings of todays visit and agrees with plan of treatment. I have discussed any further diagnostic evaluation that may be needed or ordered today. We also reviewed her medications today. she has been encouraged to call  the office with any questions or concerns that should arise related to todays visit.  Meds ordered this encounter  Medications  . triamterene-hydrochlorothiazide (MAXZIDE-25) 37.5-25 MG tablet    Sig: Take 1 tablet by mouth daily.    Dispense:  90 tablet    Refill:  3  . oxybutynin (DITROPAN XL) 10 MG 24 hr tablet    Sig: Take 1 tablet (10 mg total) by mouth at bedtime.    Dispense:  30 tablet    Refill:  6    Total time spent 35 Minutes Time spent includes review of chart, medications, test results, and follow up plan with the patient.      Dr 10-25-1991 Internal medicine

## 2020-05-15 ENCOUNTER — Encounter: Payer: Self-pay | Admitting: Hospice and Palliative Medicine

## 2020-05-15 ENCOUNTER — Ambulatory Visit: Payer: Medicare Other | Admitting: Hospice and Palliative Medicine

## 2020-05-15 ENCOUNTER — Other Ambulatory Visit: Payer: Self-pay

## 2020-05-15 VITALS — BP 126/66 | HR 58 | Temp 97.8°F | Resp 16 | Ht 63.0 in | Wt 205.0 lb

## 2020-05-15 DIAGNOSIS — I10 Essential (primary) hypertension: Secondary | ICD-10-CM | POA: Diagnosis not present

## 2020-05-15 DIAGNOSIS — I6523 Occlusion and stenosis of bilateral carotid arteries: Secondary | ICD-10-CM

## 2020-05-15 DIAGNOSIS — N393 Stress incontinence (female) (male): Secondary | ICD-10-CM

## 2020-05-15 DIAGNOSIS — R6889 Other general symptoms and signs: Secondary | ICD-10-CM

## 2020-05-15 DIAGNOSIS — M899 Disorder of bone, unspecified: Secondary | ICD-10-CM

## 2020-05-15 DIAGNOSIS — R5383 Other fatigue: Secondary | ICD-10-CM

## 2020-05-15 DIAGNOSIS — L03211 Cellulitis of face: Secondary | ICD-10-CM

## 2020-05-15 MED ORDER — OXYBUTYNIN CHLORIDE ER 10 MG PO TB24: 10.0000 mg | ORAL_TABLET | Freq: Every day | ORAL | 6 refills | Status: DC

## 2020-05-15 MED ORDER — DOXYCYCLINE HYCLATE 100 MG PO TABS: 100.0000 mg | ORAL_TABLET | Freq: Two times a day (BID) | ORAL | 0 refills | Status: DC

## 2020-05-15 NOTE — Progress Notes (Signed)
[facility] [address] [place], [place] [address]  Internal MEDICINE  Office Visit Note  Patient Name: Alexis Mcpherson  [id number]  [id number]  Date of Service: 05/21/2020  Chief Complaint  Patient presents with  . Follow-up    HPI Patient is here for routine follow-up Did not start any medications prescribed at last visit BP well controlled today without medication Continues to have urinary frequency and urgency throughout the night  Right sided face swollen and red thinks its a possible tooth infection has not been to see dentist First noticed swelling a few days ago  Has not yet been to have her labs drawn yet ordered at previous visit  Current Medication: Outpatient Encounter Medications as of 05/15/2020  Medication Sig  . doxycycline (VIBRA-TABS) 100 MG tablet Take 1 tablet (100 mg total) by mouth 2 (two) times daily.  Marland Kitchen oxybutynin (DITROPAN XL) 10 MG 24 hr tablet Take 1 tablet (10 mg total) by mouth at bedtime.  . [DISCONTINUED] oxybutynin (DITROPAN XL) 10 MG 24 hr tablet Take 1 tablet (10 mg total) by mouth at bedtime. (Patient not taking: Reported on 05/15/2020)  . [DISCONTINUED] triamterene-hydrochlorothiazide (MAXZIDE-25) 37.5-25 MG tablet Take 1 tablet by mouth daily. (Patient not taking: Reported on 05/15/2020)   No facility-administered encounter medications on file as of 05/15/2020.    Surgical History: Past Surgical History:  Procedure Laterality Date  . none      Medical History: Past Medical History:  Diagnosis Date  . Known health problems: none     Family History: Family History  Problem Relation Age of Onset  . Ulcers Mother     Social History   Socioeconomic History  . Marital status: Married    Spouse name: Not on file  . Number of children: Not on file  . Years of education: Not on file  . Highest education level: Not on file  Occupational History  . Not on file  Tobacco Use  . Smoking status: Never Smoker  . Smokeless  tobacco: Never Used  Substance and Sexual Activity  . Alcohol use: Not Currently  . Drug use: Not Currently  . Sexual activity: Not on file  Other Topics Concern  . Not on file  Social History Narrative  . Not on file   Social Determinants of Health   Financial Resource Strain: Not on file  Food Insecurity: Not on file  Transportation Needs: Not on file  Physical Activity: Not on file  Stress: Not on file  Social Connections: Not on file  Intimate Partner Violence: Not on file      Review of Systems  Constitutional: Negative for chills, diaphoresis and fatigue.  HENT: Negative for ear pain, postnasal drip and sinus pressure.        Right sided facial swelling and redness  Eyes: Negative for photophobia, discharge, redness, itching and visual disturbance.  Respiratory: Negative for cough, shortness of breath and wheezing.   Cardiovascular: Negative for chest pain, palpitations and leg swelling.  Gastrointestinal: Negative for abdominal pain, constipation, diarrhea, nausea and vomiting.  Genitourinary: Negative for dysuria and flank pain.  Musculoskeletal: Negative for arthralgias, back pain, gait problem and neck pain.  Skin: Negative for color change.  Allergic/Immunologic: Negative for environmental allergies and food allergies.  Neurological: Negative for dizziness and headaches.  Hematological: Does not bruise/bleed easily.  Psychiatric/Behavioral: Negative for agitation, behavioral problems (depression) and hallucinations.    Vital Signs: BP 126/66   Pulse (!) 58   Temp 97.8 F (36.6  C)   Resp 16   Ht 5\' 3"  (1.6 m)   Wt 205 lb (93 kg)   SpO2 99%   BMI 36.31 kg/m    Physical Exam Vitals reviewed.  Constitutional:      Appearance: Normal appearance. She is normal weight.  Cardiovascular:     Rate and Rhythm: Normal rate and regular rhythm.     Pulses: Normal pulses.     Heart sounds: Normal heart sounds.  Pulmonary:     Effort: Pulmonary effort is  normal.     Breath sounds: Normal breath sounds.  Abdominal:     General: Abdomen is flat.     Palpations: Abdomen is soft.  Musculoskeletal:        General: Normal range of motion.     Cervical back: Normal range of motion.  Skin:    General: Skin is warm.     Comments: Right facial cheek, minimal swelling and erythema  Neurological:     General: No focal deficit present.     Mental Status: She is alert and oriented to person, place, and time. Mental status is at baseline.  Psychiatric:        Mood and Affect: Mood normal.        Behavior: Behavior normal.        Thought Content: Thought content normal.        Judgment: Judgment normal.    Assessment/Plan: 1. Facial cellulitis Cellulitis vs dental abscess, encouraged to be seen by dentist Advised to contact office if area worsens or does not improve - doxycycline (VIBRA-TABS) 100 MG tablet; Take 1 tablet (100 mg total) by mouth 2 (two) times daily.  Dispense: 20 tablet; Refill: 0  2. Essential hypertension Has not been taking medication, BP remains well controlled, continue to monitor  3. Atherosclerosis of both carotid arteries Discussed initiating statin therapy, at this time she   4. Stress incontinence (female) (female) Encouraged to trial Oxybutynin for overnight urinary urgency and frequency - oxybutynin (DITROPAN XL) 10 MG 24 hr tablet; Take 1 tablet (10 mg total) by mouth at bedtime.  Dispense: 30 tablet; Refill: 6  5. Other general symptoms and signs  - B12  6. Disorder of bone, unspecified  - Vitamin D 1,25 dihydroxy  7. Other fatigue - Comprehensive Metabolic Panel (CMET) - Lipid Panel With LDL/HDL Ratio - CBC w/Diff/Platelet - B12 - Vitamin D 1,25 dihydroxy - TSH + free T4  General Counseling: Isabelly verbalizes understanding of the findings of todays visit and agrees with plan of treatment. I have discussed any further diagnostic evaluation that may be needed or ordered today. We also reviewed her  medications today. she has been encouraged to call the office with any questions or concerns that should arise related to todays visit.    Orders Placed This Encounter  Procedures  . Comprehensive Metabolic Panel (CMET)  . Lipid Panel With LDL/HDL Ratio  . CBC w/Diff/Platelet  . B12  . Vitamin D 1,25 dihydroxy  . TSH + free T4    Meds ordered this encounter  Medications  . oxybutynin (DITROPAN XL) 10 MG 24 hr tablet    Sig: Take 1 tablet (10 mg total) by mouth at bedtime.    Dispense:  30 tablet    Refill:  6  . doxycycline (VIBRA-TABS) 100 MG tablet    Sig: Take 1 tablet (100 mg total) by mouth 2 (two) times daily.    Dispense:  20 tablet    Refill:  0  Time spent: 30 Minutes   This patient was seen by Leeanne Deed AGNP-C in Collaboration with Dr Lyndon Code as a part of collaborative care agreement     Lubertha Basque. Ellanor Feuerstein AGNP-C Internal medicine

## 2020-05-21 ENCOUNTER — Encounter: Payer: Self-pay | Admitting: Hospice and Palliative Medicine

## 2020-05-27 ENCOUNTER — Telehealth: Payer: Self-pay

## 2020-05-27 ENCOUNTER — Other Ambulatory Visit: Payer: Self-pay

## 2020-05-27 DIAGNOSIS — L03211 Cellulitis of face: Secondary | ICD-10-CM

## 2020-05-27 DIAGNOSIS — N393 Stress incontinence (female) (male): Secondary | ICD-10-CM

## 2020-05-27 MED ORDER — OXYBUTYNIN CHLORIDE ER 10 MG PO TB24: 10.0000 mg | ORAL_TABLET | Freq: Every day | ORAL | 6 refills | Status: DC

## 2020-05-27 MED ORDER — DOXYCYCLINE HYCLATE 100 MG PO TABS: 100.0000 mg | ORAL_TABLET | Freq: Two times a day (BID) | ORAL | 0 refills | Status: DC

## 2020-05-27 NOTE — Telephone Encounter (Signed)
Pt daughter in law called that they need change phar resend both med to [facility]

## 2020-10-28 ENCOUNTER — Ambulatory Visit: Payer: Medicare Other | Admitting: Internal Medicine

## 2020-11-10 ENCOUNTER — Ambulatory Visit: Payer: Medicare Other | Admitting: Physician Assistant

## 2021-02-14 HISTORY — PX: REPLACEMENT TOTAL KNEE BILATERAL: SUR1225

## 2021-03-17 ENCOUNTER — Telehealth (INDEPENDENT_AMBULATORY_CARE_PROVIDER_SITE_OTHER): Payer: Medicare Other | Admitting: Nurse Practitioner

## 2021-03-17 ENCOUNTER — Encounter: Payer: Self-pay | Admitting: Nurse Practitioner

## 2021-03-17 VITALS — Ht 63.0 in | Wt 198.0 lb

## 2021-03-17 DIAGNOSIS — Z96653 Presence of artificial knee joint, bilateral: Secondary | ICD-10-CM

## 2021-03-17 NOTE — Progress Notes (Signed)
[facility] [address] [place], [place] [address]  Internal MEDICINE  Telephone Visit  Patient Name: Alexis Mcpherson  [id number]  [id number]  Date of Service: 03/17/2021  I connected with the patient at 4:25 PM by telephone and verified the patients identity using two identifiers.   I discussed the limitations, risks, security and privacy concerns of performing an evaluation and management service by telephone and the availability of in person appointments. I also discussed with the patient that there may be a patient responsible charge related to the service.  The patient expressed understanding and agrees to proceed.    Chief Complaint  Patient presents with   Telephone Assessment    [phone]   Telephone Screen    Need referral for physical therapy    HPI Una presents for a telehealth virtual visit for knee surgery done in [place], needs physical therapy referral. She is also overdue for annual wellness visit. She returned back to the area from [place] before doing any physical therapy. Bilateral knee replacements were done on 02/14/2021. She returned from [place] last week.    Current Medication: Outpatient Encounter Medications as of 03/17/2021  Medication Sig   [DISCONTINUED] doxycycline (VIBRA-TABS) 100 MG tablet Take 1 tablet (100 mg total) by mouth 2 (two) times daily.   [DISCONTINUED] oxybutynin (DITROPAN XL) 10 MG 24 hr tablet Take 1 tablet (10 mg total) by mouth at bedtime.   No facility-administered encounter medications on file as of 03/17/2021.    Surgical History: Past Surgical History:  Procedure Laterality Date   none     REPLACEMENT TOTAL KNEE BILATERAL  02/14/2021    Medical History: Past Medical History:  Diagnosis Date   Known health problems: none     Family History: Family History  Problem Relation Age of Onset   Ulcers Mother     Social History   Socioeconomic History   Marital status: Married    Spouse name: Not on file    Number of children: Not on file   Years of education: Not on file   Highest education level: Not on file  Occupational History   Not on file  Tobacco Use   Smoking status: Never   Smokeless tobacco: Never  Substance and Sexual Activity   Alcohol use: Not Currently   Drug use: Not Currently   Sexual activity: Not on file  Other Topics Concern   Not on file  Social History Narrative   Not on file   Social Determinants of Health   Financial Resource Strain: Not on file  Food Insecurity: Not on file  Transportation Needs: Not on file  Physical Activity: Not on file  Stress: Not on file  Social Connections: Not on file  Intimate Partner Violence: Not on file      Review of Systems  Constitutional:  Negative for chills, fatigue and unexpected weight change.  HENT:  Negative for congestion, rhinorrhea, sneezing and sore throat.   Eyes:  Negative for redness.  Respiratory:  Negative for cough, chest tightness and shortness of breath.   Cardiovascular: Negative.  Negative for chest pain and palpitations.  Gastrointestinal: Negative.  Negative for abdominal pain, constipation, diarrhea, nausea and vomiting.  Genitourinary:  Negative for dysuria and frequency.  Musculoskeletal:  Positive for arthralgias and gait problem (recent knee surgery, needs physical therapy). Negative for back pain, joint swelling and neck pain.  Skin:  Negative for rash.  Neurological:  Negative for tremors and numbness.  Hematological:  Negative for adenopathy. Does  not bruise/bleed easily.  Psychiatric/Behavioral:  Negative for behavioral problems (Depression), sleep disturbance and suicidal ideas. The patient is not nervous/anxious.    Vital Signs: Ht 5\' 3"  (1.6 m)    Wt 198 lb (89.8 kg)    BMI 35.07 kg/m    Observation/Objective: She is alert and oriented and engages in conversation appropriately. Her daughter is assisting her. She does not sound as though she is in any acute distress over telephone  call.     Assessment/Plan: 1. Status post bilateral knee replacements Patient had bilateral knee replacement surgery on 02/14/2021. She needs physical therapy and just returned from 02/16/2021 last week.  - Ambulatory referral to Physical Therapy   General Counseling: Shawnie verbalizes understanding of the findings of today's phone visit and agrees with plan of treatment. I have discussed any further diagnostic evaluation that may be needed or ordered today. We also reviewed her medications today. she has been encouraged to call the office with any questions or concerns that should arise related to todays visit.  Return for CPE, Alva Kuenzel PCP earliest available. .   Orders Placed This Encounter  Procedures   Ambulatory referral to Physical Therapy    No orders of the defined types were placed in this encounter.   Time spent:15 Minutes Time spent with patient included reviewing progress notes, labs, imaging studies, and discussing plan for follow up.  [place] Controlled Substance Database was reviewed by me for overdose risk score (ORS) if appropriate.  This patient was seen by [place], FNP-C in collaboration with Dr. Sallyanne Kuster as a part of collaborative care agreement.  Leandre Wien R. Beverely Risen, MSN, FNP-C Internal medicine

## 2021-03-24 ENCOUNTER — Other Ambulatory Visit: Payer: Self-pay

## 2021-03-24 ENCOUNTER — Encounter: Payer: Self-pay | Admitting: Physical Therapy

## 2021-03-24 ENCOUNTER — Ambulatory Visit: Payer: Medicare Other | Attending: Nurse Practitioner | Admitting: Physical Therapy

## 2021-03-24 VITALS — BP 111/55 | HR 75

## 2021-03-24 DIAGNOSIS — M25562 Pain in left knee: Secondary | ICD-10-CM | POA: Diagnosis present

## 2021-03-24 DIAGNOSIS — M25561 Pain in right knee: Secondary | ICD-10-CM | POA: Insufficient documentation

## 2021-03-24 DIAGNOSIS — R262 Difficulty in walking, not elsewhere classified: Secondary | ICD-10-CM | POA: Insufficient documentation

## 2021-03-24 DIAGNOSIS — Z96653 Presence of artificial knee joint, bilateral: Secondary | ICD-10-CM | POA: Diagnosis not present

## 2021-03-24 NOTE — Therapy (Signed)
[facility] [address], [place], [address] Phone: [phone]   Fax:  [phone]  Physical Therapy Evaluation  Patient Details  Name: Alexis Mcpherson MRN: [id number] Date of Birth: 03-10-1950 Referring Provider (PT): Sallyanne Kuster, NP    Encounter Date: 03/24/2021   PT End of Session - 03/24/21 1539     Visit Number 1    Number of Visits 16    Date for PT Re-Evaluation 05/19/21    Authorization Type Medicare    PT Start Time 0330    PT Stop Time 0400    PT Time Calculation (min) 30 min             Past Medical History:  Diagnosis Date   Known health problems: none     Past Surgical History:  Procedure Laterality Date   none     REPLACEMENT TOTAL KNEE BILATERAL  02/14/2021    Vitals:   03/24/21 1525  BP: (!) 111/55  Pulse: 75      Subjective Assessment - 03/24/21 1525     Subjective Patient feels like recovery has been going well, and that her pain is much improved since before the surgery.    Patient is accompained by: Family member    Pertinent History Patient received bilateral knee replacements in [place] on 02/14/21. She received home health PT in [place], and now she would like to continue PT in outpatient setting.    Limitations Standing    How long can you sit comfortably? She use to sit on floor but now she cannot.    How long can you stand comfortably? Standing for a long period of time causes her a lot of pain and she needs to sit down.    How long can you walk comfortably? Feels pain walking long distances    Patient Stated Goals Walk better and more stable and she wants to be able to sit on floor cross legged like she use too.    Currently in Pain? Yes    Pain Score 2     Pain Location Knee    Pain Orientation Right;Left    Pain Descriptors / Indicators Aching    Pain Type Chronic pain    Pain Onset More than a month ago    Pain Frequency Intermittent    Aggravating  Factors  Walking and standing for a long period of time    Pain Relieving Factors Sitting or non-weight bearing status    Effect of Pain on Daily Activities None                [facility] PT Assessment - 03/24/21 0001       Assessment   Medical Diagnosis Bilateral TKA    Referring Provider (PT) Sallyanne Kuster, NP    Onset Date/Surgical Date 02/14/21    Prior Therapy Yes      Precautions   Precautions None      Restrictions   Weight Bearing Restrictions No      Balance Screen   Has the patient fallen in the past 6 months No    Has the patient had a decrease in activity level because of a fear of falling?  No    Is the patient reluctant to leave their home because of a fear of falling?  No      Home Environment   Living Environment Private residence    Living Arrangements Children    Available Help at Discharge  Family    Type of [place] to enter    Entrance Stairs-Number of Steps 3    Entrance Stairs-Rails None    Home Layout Two level    Alternate Level Stairs-Number of Steps 13    Alternate Level Stairs-Rails Right      Prior Function   Level of Independence Independent    Vocation Retired      Charity fundraiser Status Within Functional Limits for tasks assessed             KNEE EVALUATION   Observation: Posture: Forward flexed rounded shoulders  Gait: Decreased step length and ER of foot due to excessive pronation. Increased lateral sway  Stairs: NT  Palpation: TTP on anterior knee over patella and TTP of lateral left ankle with edema noted     Strength:           R         L  Knee Flexion:      4+/5      4+/5 Knee Extension:    4+/5      4+/5  Hip Flexion:       5/5       5/5 Hip Extension:     NT        NT  Hip ER:            NT         NT Hip IR:            NT         NT Hip Abduction:     5/5       5/5 Hip Adduction:     5/5       5/5  Ankle DF:          5/5       5/5 Ankle PF:          5/5        5/5    Hip  ROM                      R                 L                   AROM     PROM     AROM    PROM         NORMS  Flexion       120      120      120     120           120 Extension     20       20        20      20            20  Abduction     NT       NT       NT       NT            45 Adduction     30       30        30      30             30  Knee ROM            R                 L              NORMS               AROM     PROM      AROM   PROM Flexion:      150      150       150    150           150  Extension:    0         0         0      0            0-10  Ankle               R                 L               NORMS                AROM    PROM          AROM    PROM DF             20      20           20      20         20  PF             50      50           50      50         50  Inv            NT      NT           NT      NT         35 Ever           NT      NT          NT       NT         15   Special Tests:    Flexibility tests: Deferred until next visit  [employer]                   NT  Ober's Test                     NT Hamstring 90/90           (-/-) Ely's Test                       NT  Gastroc/soleus               NT   Functional Assessment: Deferred until next visit Squat Single leg stance Single leg squat    10 Meter Walk Test   Gait Speed:  - Normal Gait speed Avg=   0.63 m/sec    <1 m/sec Need Intervention to reduce falls risk  0.12 m/sec improvement represents a statistically significant improvement in gait speed    - 0.4 - 0.79 m/sec - limited community  ambulator - associated with need for intervention to reduce falls; increased risk for LE limitation and death and hospitalization in 1 year; increased dependence for personal care; 2-year mortality and morbidity; increased functional impairments, severe walking disability  THEREX:  Supine HS Stretch with strap 3 x 30 sec  -Provided overpressure for increased  stretch   Sidelying Quad Stretch 3 x 30 sec  -min TC for placement of strap               PT Short Term Goals - 03/24/21 1655       PT SHORT TERM GOAL #1   Title Patient will demonstrate understanding of HEP for improved self-management of rehab.    Baseline 03/24/21: NT    Time 2    Period Weeks               PT Long Term Goals - 03/24/21 1632       PT LONG TERM GOAL #1   Title Patient will have improved function and activity level as evidenced by an increase in FOTO score by 10 points or more.    Baseline 03/24/21: 53/62    Time 8    Period Weeks    Status New    Target Date 05/19/21      PT LONG TERM GOAL #2   Title Patient will improve knee flexion AROM to >=130 degrees and to improve knee extension AROM to <=0 degrees to normalize gait and to be able to achieve cross legged posture.    Baseline 3/7: Knee Flex R/L 110/110 , Knee Ext R/L    Time 8    Period Weeks    Status New    Target Date 05/19/21                    Plan - 03/24/21 1648     Clinical Impression Statement Pt is a 71 yo female that presents for initial eval s/p 5 weeks for bilateral TKA that was performed in [place] on 1/28. She presents with decreased knee ROM and increased left ankle pain and knee pain with prolonged ambulation and standing. Pt also exhibits gait deficits that include increased lateral sway and decreased step length due to excessive ankle pronation. She will continue to benefit from skilled PT to improve knee ROM and decrease left ankle pain and swelling to be able to ambulate with increased stability to avoid lossing her balance and to better navigate her home environment.    Personal Factors and Comorbidities Age;Past/Current Experience;Time since onset of injury/illness/exacerbation    Examination-Activity Limitations Locomotion Level;Stand    Examination-Participation Restrictions Meal Prep    Stability/Clinical Decision Making Stable/Uncomplicated    Clinical  Decision Making Low    Rehab Potential Fair    PT Frequency 2x / week    PT Treatment/Interventions Aquatic Therapy;Cryotherapy;Moist Heat;ADLs/Self Care Home Management;Gait training;Balance training;Neuromuscular re-education;Therapeutic exercise;DME Instruction;Manual techniques;Joint Manipulations;Spinal Manipulations;Passive range of motion;Dry needling;Patient/family education    PT Next Visit Plan Finish ankle ROM and hip strength measurements, flexibility tests, assess knee joint play, assess patient with rollator    PT Home Exercise Plan Supine HS stretch 3 x 30 sec x 7, Sidelying Quad Stretch 3 x 30 sec x 7    Consulted and Agree with Plan of Care Patient;Family member/caregiver             Patient will benefit from skilled therapeutic intervention in order to improve the following deficits and impairments:  Abnormal gait, Pain, Impaired flexibility,  Difficulty walking, Decreased range of motion, Postural dysfunction, Increased edema, Decreased balance, Decreased endurance  Visit Diagnosis: Acute pain of right knee  Acute pain of left knee  Difficulty in walking, not elsewhere classified     Problem List There are no problems to display for this patient.  Bradly Chris PT, DPT   Daneil Dan, PT 03/24/2021, 5:01 PM  [facility] [address] [place], [address] Phone: [phone]   Fax:  [phone]  Name: Alexis Mcpherson MRN: [id number] Date of Birth: [birth year]

## 2021-03-30 ENCOUNTER — Other Ambulatory Visit: Payer: Self-pay

## 2021-03-30 ENCOUNTER — Ambulatory Visit: Payer: Medicare Other | Admitting: Physical Therapy

## 2021-03-30 DIAGNOSIS — M25561 Pain in right knee: Secondary | ICD-10-CM

## 2021-03-30 DIAGNOSIS — R262 Difficulty in walking, not elsewhere classified: Secondary | ICD-10-CM

## 2021-03-30 DIAGNOSIS — M25562 Pain in left knee: Secondary | ICD-10-CM

## 2021-03-30 NOTE — Therapy (Signed)
[place] ?[facility] ?[address] ?[place], [place], [address] ?Phone: [phone]   Fax:  [phone] ? ?Physical Therapy Treatment ? ?Patient Details  ?Name: Alexis Mcpherson ?MRN: [id number] ?Date of Birth: 06-May-1950 ?Referring Provider (PT): Sallyanne Kuster, NP ? ? ?Encounter Date: 03/30/2021 ? ? PT End of Session - 03/30/21 1426   ? ? Visit Number 2   ? Number of Visits 16   ? Date for PT Re-Evaluation 05/19/21   ? Authorization Type Medicare   ? PT Start Time 1420   ? PT Stop Time 1500   ? PT Time Calculation (min) 40 min   ? Equipment Utilized During Treatment Gait belt   ? Activity Tolerance Patient tolerated treatment well   ? Behavior During Therapy [facility] for tasks assessed/performed   ? ?  ?  ? ?  ? ? ?Past Medical History:  ?Diagnosis Date  ? Known health problems: none   ? ? ?Past Surgical History:  ?Procedure Laterality Date  ? none    ? 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 ? ?There were no vitals filed for this visit. ? ? Subjective Assessment - 03/30/21 1423   ? ? Subjective Pt reports that she is not feeling pain today and she tends to feel it when sitting for a long time. She has not experienced any falls since last session.   ? Patient is accompained by: Family member   ? Pertinent History Patient received bilateral knee replacements in [place] on 02/14/21. She received home health PT in [place], and now she would like to continue PT in outpatient setting.   ? Limitations Standing   ? How long can you sit comfortably? She use to sit on floor but now she cannot.   ? How long can you stand comfortably? Standing for a long period of time causes her a lot of pain and she needs to sit down.   ? How long can you walk comfortably? Feels pain walking long distances   ? Diagnostic tests Awaiting documentation from daughter   ? Patient Stated Goals Walk better and more stable and she wants to be able to sit on floor cross legged like she use too.   ?  Currently in Pain? No/denies   ? Pain Onset More than a month ago   ? ?  ?  ? ?  ? ?THEREX: ? ?Ankle Inversion R/L 20/20 ?Ankle Eversion R/L  10/10  ? ?Hip IR R/L 4+/4+ ?Hip ER R/L 4+/4+ ? ?5 x STS: 23 sec  ?-<11.4 sec is at risk for falling  ? ?30 sec chair stands: 6 reps  ?<11 reps for 30-69 yo female  ? ? ?10 Meter Walk Test  ? ?Ambulate with rollator: 16.5 + 17.4= 16.95 sec   16.95 sec/10 m= 0.60 m/sec  ?-Decreased lateral sway  ? ? ?Ambulate with SPC: 16.6 + 17.3= 16.95 sec  ?16.95 sec/10 m= 0.60 m/sec ?-Increased lateral sway  ? ?<1 m/sec Need Intervention to reduce falls risk  ? ?- 0.4 - 0.79 m/sec - limited community ambulator ?- associated with need for intervention to reduce falls; increased risk for LE limitation and death and hospitalization in 1 year; increased dependence for personal care; 2-year mortality and morbidity; increased functional impairments, severe walking disability ? ? ? ?Muscle Length Testing:  ? ?SLR R/L: -/ -  ? ? ? ? ? ? ? ? ? ? ? ? ? ? ? ? ? ? ? ? ? ?  PT Education - 03/30/21 1426   ? ? Education Details form and technique for appropriate exercise   ? Person(s) Educated Patient   ? Methods Explanation;Demonstration;Verbal cues;Handout   ? Comprehension Returned demonstration;Verbalized understanding;Verbal cues required   ? ?  ?  ? ?  ? ? ? PT Short Term Goals - 03/30/21 1427   ? ?  ? PT SHORT TERM GOAL #1  ? Title Patient will demonstrate understanding of HEP for improved self-management of rehab.   ? Baseline 03/24/21: NT   ? Time 2   ? Period Weeks   ? ?  ?  ? ?  ? ? ? ? PT Long Term Goals - 03/30/21 1427   ? ?  ? PT LONG TERM GOAL #1  ? Title Patient will have improved function and activity level as evidenced by an increase in FOTO score by 10 points or more.   ? Baseline 03/24/21: 53/62   ? Time 8   ? Period Weeks   ? Status On-going   ? Target Date 05/19/21   ?  ? PT LONG TERM GOAL #2  ? Title Patient will improve knee flexion AROM to >=130 degrees and to improve knee extension  AROM to <=0 degrees to normalize gait and to be able to achieve cross legged posture.   ? Baseline 3/7: Knee Flex R/L 110/110 , Knee Ext R/L   ? Time 8   ? Period Weeks   ? Status On-going   ? Target Date 05/19/21   ? ?  ?  ? ?  ? ? ? ? ? ? ? ? Plan - 03/30/21 1427   ? ? Clinical Impression Statement Pt exhibits increased ankle ER mobility along with increased lateral sway without use of an AD. She also exhibits decreased LE weakness that places her at an increased risk for falls. Pt shows improved gait mechanics with decreased lateral sway with use of rollator. PT recommends use of rollator outside of home and use of SPC in home. She will benefit from skilled PT to increase LE strength and to increase trunk stability to decrease her risk of falling.   ? Personal Factors and Comorbidities Age;Past/Current Experience;Time since onset of injury/illness/exacerbation   ? Examination-Activity Limitations Locomotion Level;Stand   ? Examination-Participation Restrictions Meal Prep   ? Stability/Clinical Decision Making Stable/Uncomplicated   ? Clinical Decision Making Low   ? Rehab Potential Fair   ? PT Frequency 2x / week   ? PT Treatment/Interventions Aquatic Therapy;Cryotherapy;Moist Heat;ADLs/Self Care Home Management;Gait training;Balance training;Neuromuscular re-education;Therapeutic exercise;DME Instruction;Manual techniques;Joint Manipulations;Spinal Manipulations;Passive range of motion;Dry needling;Patient/family education   ? PT Next Visit Plan Assess knee joint play. Attempt knee mobilizations and progress abdominal strengthening exercises   ? PT Home Exercise Plan [email or web address]   ? Consulted and Agree with Plan of Care Patient;Family member/caregiver   ? ?  ?  ? ?  ? ?HEP includes the following:  ? ?Access Code: [email or web address] ?URL: [email or web address] ?Date: 03/30/2021 ?Prepared by: Ellin Goodieaniel Lynae Pederson ? ?Exercises ?Seated Table Hamstring Stretch - 1 x daily - 7 x weekly - 1 sets - 3 reps - 60 hold ?Sit to  Stand - 1 x daily - 3 x weekly - 2 sets - 10 reps ? ?Patient will benefit from skilled therapeutic intervention in order to improve the following deficits and impairments:  Abnormal gait, Pain, Impaired flexibility, Difficulty walking, Decreased range of motion, Postural dysfunction, Increased edema, Decreased balance, Decreased endurance ? ?Visit Diagnosis: ?Acute pain of right knee ? ?  Acute pain of left knee ? ?Difficulty in walking, not elsewhere classified ? ? ? ? ?Problem List ?There are no problems to display for this patient. ? ?Ellin Goodie PT, DPT  ?03/30/2021, 4:09 PM ? ?[place] ?[facility] ?[address] ?[place], [place], [address] ?Phone: [phone]   Fax:  [phone] ? ?Name: Alexis Mcpherson ?MRN: [id number] ?Date of Birth: [birth year] ? ? ? ?

## 2021-03-31 ENCOUNTER — Ambulatory Visit: Payer: Medicare Other | Admitting: Physical Therapy

## 2021-04-02 ENCOUNTER — Ambulatory Visit: Payer: Medicare Other | Admitting: Physical Therapy

## 2021-04-02 ENCOUNTER — Other Ambulatory Visit: Payer: Self-pay

## 2021-04-02 ENCOUNTER — Encounter: Payer: Self-pay | Admitting: Physical Therapy

## 2021-04-02 DIAGNOSIS — M25561 Pain in right knee: Secondary | ICD-10-CM | POA: Diagnosis not present

## 2021-04-02 NOTE — Therapy (Signed)
[place] ?[facility] ?[address] ?[place], [place], [address] ?Phone: [phone]   Fax:  [phone] ? ?Physical Therapy Treatment ? ?Patient Details  ?Name: Alexis LassoMrudula Alexis Mcpherson ?MRN: [id number] ?Date of Birth: 26-Apr-1950 ?Referring Provider (PT): Sallyanne KusterAlyssa Abernathy, NP ? ? ?Encounter Date: 04/02/2021 ? ? PT End of Session - 04/02/21 1428   ? ? Visit Number 3   ? Number of Visits 16   ? Date for PT Re-Evaluation 05/19/21   ? Authorization Type Medicare   ? PT Start Time 1420   ? PT Stop Time 1500   ? PT Time Calculation (min) 40 min   ? Equipment Utilized During Treatment Gait belt   ? Activity Tolerance Patient tolerated treatment well   ? Behavior [facility] for tasks assessed/performed   ? ?  ?  ? ?  ? ? ?Past Medical History:  ?Diagnosis Date  ? Known health problems: none   ? ? ?Past Surgical History:  ?Procedure Laterality Date  ? none    ? 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 ? ?There were no vitals filed for this visit. ? ? Subjective Assessment - 04/02/21 1426   ? ? Subjective Pt states that she has some increase in her knee pain but she was still able to do her exercises without much difficulty.   ? Patient is accompained by: Family member   ? Pertinent History Patient received bilateral knee replacements in [place] on 02/14/21. She received home health PT in [place], and now she would like to continue PT in outpatient setting.   ? Limitations Standing   ? How long can you sit comfortably? She use to sit on floor but now she cannot.   ? How long can you stand comfortably? Standing for a long period of time causes her a lot of pain and she needs to sit down.   ? How long can you walk comfortably? Feels pain walking long distances   ? Diagnostic tests Awaiting documentation from daughter   ? Patient Stated Goals Walk better and more stable and she wants to be able to sit on floor cross legged like she use too.   ? Currently in Pain? Yes   ? Pain  Score 3    ? Pain Location Knee   ? Pain Orientation Right;Left   ? Pain Descriptors / Indicators Aching   ? Pain Type Chronic pain   ? Pain Onset More than a month ago   ? ?  ?  ? ?  ? ? ?THEREX:  ? ?Nu-Step Level 1 Resistance and Seat Level 7  ? ?Standing AAROM Knee Flexion onto 6 inch step with BUE support 3 x 10  ? ?Supine Bridge  ?-Unable to perform due to glute weakness  ? ?Supine Crunches 2 x 5  ?-mod VC for feet position  ? ?Sidelying Quad Stretch 3 x 60 sec  ? ?Sit to Stand 2 x 10  ? ? ? ? ? ? ? ? ? ? ? ? ? ? ? ? ? ? ? ? PT Education - 04/02/21 1427   ? ? Education Details form and technique for appropriate exercise   ? Person(s) Educated Patient   ? Methods Explanation;Demonstration;Verbal cues;Handout   ? Comprehension Verbalized understanding;Returned demonstration;Verbal cues required;Tactile cues required   ? ?  ?  ? ?  ? ? ? PT Short Term Goals - 04/02/21 1429   ? ?  ? PT SHORT TERM  GOAL #1  ? Title Patient will demonstrate understanding of HEP for improved self-management of rehab.   ? Baseline 03/24/21: NT   ? Time 2   ? Period Weeks   ? ?  ?  ? ?  ? ? ? ? PT Long Term Goals - 04/02/21 1436   ? ?  ? PT LONG TERM GOAL #1  ? Title Patient will have improved function and activity level as evidenced by an increase in FOTO score by 10 points or more.   ? Baseline 03/24/21: 53/62   ? Time 8   ? Period Weeks   ? Status On-going   ? Target Date 05/19/21   ?  ? PT LONG TERM GOAL #2  ? Title Patient will improve knee flexion AROM to >=130 degrees and to improve knee extension AROM to <=0 degrees to normalize gait and to be able to achieve cross legged posture.   ? Baseline 3/7: Knee Flex R/L 110/110 , Knee Ext R/L   ? Time 8   ? Period Weeks   ? Status On-going   ? Target Date 05/19/21   ? ?  ?  ? ?  ? ? ? ? ? ? ? ? Plan - 04/02/21 1429   ? ? Clinical Impression Statement Pt presents with generalized weakness especially with glute strength with inability to perform a bridge and with decreased hip extension. She  also continues experience pain with increased knee flexion with standing lunges on step and quadriceps stretch. Pt's ability to ambulate with stability is also greatly affected by excessive pronation of right ankle and decreased hip flexion. Future sessions will be focused on knee as well as generalized LE strengthening to improve gait mechanics to increase patient mobility and to decrease her risk of falling.   ? Personal Factors and Comorbidities Age;Past/Current Experience;Time since onset of injury/illness/exacerbation   ? Examination-Activity Limitations Locomotion Level;Stand   ? Examination-Participation Restrictions Meal Prep   ? Stability/Clinical Decision Making Stable/Uncomplicated   ? Clinical Decision Making Low   ? Rehab Potential Fair   ? PT Frequency 2x / week   ? PT Treatment/Interventions Aquatic Therapy;Cryotherapy;Moist Heat;ADLs/Self Care Home Management;Gait training;Balance training;Neuromuscular re-education;Therapeutic exercise;DME Instruction;Manual techniques;Joint Manipulations;Spinal Manipulations;Passive range of motion;Dry needling;Patient/family education   ? PT Next Visit Plan Assess knee joint play. Attempt knee mobilizations. Continue with glute and abdominal strengthening. Discuss use of shoes for ankle   ? PT Home Exercise Plan [email or web address]   ? Consulted and Agree with Plan of Care Patient;Family member/caregiver   ? ?  ?  ? ?  ? ?HEP includes the following:  ? ?Access Code: [email or web address] ?URL: [email or web address] ?Date: 04/02/2021 ?Prepared by: Ellin Goodie ? ?Exercises ?Seated Table Hamstring Stretch - 1 x daily - 7 x weekly - 1 sets - 3 reps - 60 hold ?Sit to Stand - 1 x daily - 3 x weekly - 2 sets - 10 reps ?Sidelying Quadriceps Stretch with Strap - 1 x daily - 7 x weekly - 1 sets - 3 reps - 60 hold ? ? ?Patient will benefit from skilled therapeutic intervention in order to improve the following deficits and impairments:  Abnormal gait, Pain, Impaired flexibility,  Difficulty walking, Decreased range of motion, Postural dysfunction, Increased edema, Decreased balance, Decreased endurance ? ?Visit Diagnosis: ?Acute pain of right knee ? ?Acute pain of left knee ? ?Difficulty in walking, not elsewhere classified ? ? ? ? ?Problem List ?There are no problems to display for this patient. ? ?Ellin Goodie  PT, DPT  ?04/02/2021, 5:09 PM ? ?[place] ?[facility] ?[address] ?[place], [place], [address] ?Phone: [phone]   Fax:  [phone] ? ?Name: Alexis Alexis Mcpherson ?MRN: [id number] ?Date of Birth: 1950-11-20 ? ? ? ?

## 2021-04-07 ENCOUNTER — Ambulatory Visit: Payer: Medicare Other | Admitting: Physical Therapy

## 2021-04-07 ENCOUNTER — Encounter: Payer: Self-pay | Admitting: Physical Therapy

## 2021-04-07 ENCOUNTER — Other Ambulatory Visit: Payer: Self-pay

## 2021-04-07 DIAGNOSIS — M25561 Pain in right knee: Secondary | ICD-10-CM | POA: Diagnosis not present

## 2021-04-07 DIAGNOSIS — R262 Difficulty in walking, not elsewhere classified: Secondary | ICD-10-CM

## 2021-04-07 DIAGNOSIS — M25562 Pain in left knee: Secondary | ICD-10-CM

## 2021-04-07 NOTE — Therapy (Signed)
?OUTPATIENT PHYSICAL THERAPY TREATMENT NOTE ? ? ?Patient Name: Alexis Mcpherson ?MRN: [id number] ?DOB:[birth year], 71 y.o., female ?Today's Date: 04/08/2021 ? ?PCP: Sallyanne Kuster, NP ?REFERRING PROVIDER: Sallyanne Kuster, NP ? ? PT End of Session - 04/07/21 1425   ? ? Visit Number 4   ? Number of Visits 16   ? Date for PT Re-Evaluation 05/19/21   ? Authorization Type Medicare   ? PT Start Time 1420   ? PT Stop Time 1500   ? PT Time Calculation (min) 40 min   ? Equipment Utilized During Treatment Gait belt   ? Activity Tolerance Patient tolerated treatment well   ? Behavior During Therapy [facility] for tasks assessed/performed   ? ?  ?  ? ?  ? ? ?Past Medical History:  ?Diagnosis Date  ? Known health problems: none   ? ?Past Surgical History:  ?Procedure Laterality Date  ? none    ? 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 ?There are no problems to display for this patient. ? ? ?REFERRING DIAG: Bilateral TKA  ? ?THERAPY DIAG:  ?Acute pain of right knee ? ?Acute pain of left knee ? ?Difficulty in walking, not elsewhere classified ? ?PERTINENT HISTORY: Patient received bilateral knee replacements in [place] on 02/14/21. She received home health PT in [place], and now she would like to continue PT in outpatient setting.  ? ?PRECAUTIONS: None  ? ?SUBJECTIVE: Pt reports increased in the posterior side of her RLE. She describes it starting behind her knee and going down to her ankle  ? ?PAIN:  ?Are you having pain? Yes: NPRS scale: 5/10 ?Pain location: right ankle on lateral and medial tendons  ?Pain description: achy  ?Aggravating factors: walking  ?Relieving factors: Being off her feet  ? ? ? ? ?TODAY'S TREATMENT:  ?03/16/21 ?Calf Stretch on RLE 3 x 60 sec  ?Posterior Calf Stretch on RLE 30 sec ? ?Palpation of Right Ankle: TTP on medial malleolus over posterior tib tendon ?  ?Good mornings for paraspinals with #4 DB 2 x 10  ?-min VC to maintain knee extension and to extend all the way back  ? ?PATIENT EDUCATION: ?Education  details: form and technique for appropriate exercise  ?Person educated: Patient ?Education method: Explanation ?Education comprehension: verbalized understanding, returned demonstration, and verbal cues required ? ? ?HOME EXERCISE PROGRAM: ?Access Code: [email or web address] ?URL: [email or web address] ?Date: 04/07/2021 ?Prepared by: Ellin Goodie ? ?Exercises ?Seated Table Hamstring Stretch - 1 x daily - 7 x weekly - 1 sets - 3 reps - 60 hold ?Tibialis Posterior Stretch at Wall - 1 x daily - 7 x weekly - 1 sets - 3 reps - 60 hold ?Sidelying Quadriceps Stretch with Strap - 1 x daily - 7 x weekly - 1 sets - 3 reps - 60 hold ?Sit to Stand - 1 x daily - 4 x weekly - 3 sets - 10 reps ?Good Morning with Resistance - 1 x daily - 4 x weekly - 3 sets - 10 reps ?Seated Calf Stretch with Strap - 1 x daily - 7 x weekly - 1 sets - 3 reps - 60 hold ? ? ? ? PT Short Term Goals   ? ?  ? PT SHORT TERM GOAL #1  ? Title Patient will demonstrate understanding of HEP for improved self-management of rehab.   ? Baseline 03/24/21: NT   ? Time 2   ? Period Weeks   ? ?  ?  ? ?  ? ? ? PT Long Term  Goals  ? ?  ? PT LONG TERM GOAL #1  ? Title Patient will have improved function and activity level as evidenced by an increase in FOTO score by 10 points or more.   ? Baseline 03/24/21: 53/62   ? Time 8   ? Period Weeks   ? Status On-going   ? Target Date 05/19/21   ?  ? PT LONG TERM GOAL #2  ? Title Patient will improve knee flexion AROM to >=130 degrees and to improve knee extension AROM to <=0 degrees to normalize gait and to be able to achieve cross legged posture.   ? Baseline 3/7: Knee Flex R/L 110/110 , Knee Ext R/L   ? Time 8   ? Period Weeks   ? Status On-going   ? Target Date 05/19/21   ? ?  ?  ? ?  ? ? ? Plan   ? ? Clinical Impression Statement Pt tolerates all exercises without an increase in her knee and ankle pain. Pt exhibits excessive ankle pronation along with right ankle pain around medial and lateral side. Stretching of posterior  tib on right ankle brought some relief. Given forward flexion, pt given lumbar extension to strengthen paraspinals.   ? Personal Factors and Comorbidities Age;Past/Current Experience;Time since onset of injury/illness/exacerbation   ? Examination-Activity Limitations Locomotion Level;Stand   ? Examination-Participation Restrictions Meal Prep   ? Stability/Clinical Decision Making Stable/Uncomplicated   ? Clinical Decision Making Low   ? Rehab Potential Fair   ? PT Frequency 2x / week   ? PT Treatment/Interventions Aquatic Therapy;Cryotherapy;Moist Heat;ADLs/Self Care Home Management;Gait training;Balance training;Neuromuscular re-education;Therapeutic exercise;DME Instruction;Manual techniques;Joint Manipulations;Spinal Manipulations;Passive range of motion;Dry needling;Patient/family education   ? PT Next Visit Plan Assess knee joint play. Attempt knee mobilizations. Continue with glute and abdominal strengthening. Discuss use of shoes for ankle   ? PT Home Exercise Plan [email or web address]   ? Consulted and Agree with Plan of Care Patient;Family member/caregiver   ? ?  ?  ? ?  ? ? ? ?Ellin Goodie PT, DPT ?Johnn Hai, PT ?04/08/2021, 1:02 PM ? ?  ? ?

## 2021-04-08 ENCOUNTER — Encounter: Payer: Self-pay | Admitting: Physical Therapy

## 2021-04-13 ENCOUNTER — Ambulatory Visit: Payer: Medicare Other | Admitting: Physical Therapy

## 2021-04-13 ENCOUNTER — Encounter: Payer: Self-pay | Admitting: Physical Therapy

## 2021-04-13 ENCOUNTER — Other Ambulatory Visit: Payer: Self-pay

## 2021-04-13 DIAGNOSIS — M25562 Pain in left knee: Secondary | ICD-10-CM

## 2021-04-13 DIAGNOSIS — M25561 Pain in right knee: Secondary | ICD-10-CM | POA: Diagnosis not present

## 2021-04-13 DIAGNOSIS — R262 Difficulty in walking, not elsewhere classified: Secondary | ICD-10-CM

## 2021-04-13 NOTE — Therapy (Signed)
?OUTPATIENT PHYSICAL THERAPY TREATMENT NOTE ? ? ?Patient Name: Alexis Mcpherson ?MRN: [id number] ?DOB:1950/10/30, 71 y.o., female ?Today's Date: 04/13/2021 ? ?PCP: Jonetta Osgood, NP ?REFERRING PROVIDER: Jonetta Osgood, NP ? ? PT End of Session - 04/13/21 1540   ? ? Visit Number 5   ? Number of Visits 16   ? Date for PT Re-Evaluation 05/19/21   ? Authorization Type Medicare   ? PT Start Time 1415   ? PT Stop Time 1500   ? PT Time Calculation (min) 45 min   ? Equipment Utilized During Treatment Gait belt   ? Activity Tolerance Patient tolerated treatment well   ? Behavior During Therapy [facility] for tasks assessed/performed   ? ?  ?  ? ?  ? ? ? ?Past Medical History:  ?Diagnosis Date  ? Known health problems: none   ? ?Past Surgical History:  ?Procedure Laterality Date  ? none    ? 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 ?There are no problems to display for this patient. ? ? ?REFERRING DIAG: Bilateral TKA  ? ?THERAPY DIAG:  ?Acute pain of right knee ? ?Acute pain of left knee ? ?Difficulty in walking, not elsewhere classified ? ?PERTINENT HISTORY: Patient received bilateral knee replacements in [place] on 02/14/21. She received home health PT in [place], and now she would like to continue PT in outpatient setting.  ? ?PRECAUTIONS: None  ? ?SUBJECTIVE: Pt reports walking a lot over [place] and around her [place]. While she did not experience increased knee pain, she did have increased right ankle pain. She wants to know how long she can walk and stand for.  ? ?PAIN:  ?Are you having pain? No  ? ? ? ? ?TODAY'S TREATMENT:  ?04/13/21  ?Nu-Step  Level 1 Seat 7 and Arm Paddles 6 for 7 min  ? ?Seated Hip Abduction with Blue TB 1 x 10   ? ?Sidelying Hip Abduction with Blue TB 3 x 10  ?-mod VC to maintain neutral rotation of hip to prevent compensatory posterior rotation  ? ?            Seated Hip Adduction isometric 5 sec 3 x 10  ? ?Gait analysis: Pt exhibits bilateral hip drop with decrease hip flexion during swing  phase and hip ER throughout stance and swing  ? ? ?03/16/21 ?Calf Stretch on RLE 3 x 60 sec  ?Posterior Calf Stretch on RLE 30 sec ? ?Palpation of Right Ankle: TTP on medial malleolus over posterior tib tendon ?  ?Good mornings for paraspinals with #4 DB 2 x 10  ?-min VC to maintain knee extension and to extend all the way back  ? ?PATIENT EDUCATION: ?Education details: form and technique for appropriate exercise  ?Person educated: Patient ?Education method: Explanation ?Education comprehension: verbalized understanding, returned demonstration, and verbal cues required ? ? ?HOME EXERCISE PROGRAM: ?Access Code: [email or web address] ?URL: [email or web address] ?Date: 04/13/2021 ?Prepared by: Bradly Chris ? ?Exercises ?- Seated Table Hamstring Stretch  - 1 x daily - 7 x weekly - 1 sets - 3 reps - 60 hold ?- Tibialis Posterior Stretch at Wall  - 1 x daily - 7 x weekly - 1 sets - 3 reps - 60 hold ?- Seated Calf Stretch with Strap  - 1 x daily - 7 x weekly - 1 sets - 3 reps - 60 hold ?- Sidelying Quadriceps Stretch with Strap  - 1 x daily - 7 x weekly - 1 sets - 3 reps - 60 hold ?-  Sit to Stand  - 1 x daily - 4 x weekly - 3 sets - 10 reps ?- Good Morning with Resistance  - 1 x daily - 4 x weekly - 3 sets - 10 reps ?- Clamshell  - 1 x daily - 4 x weekly - 3 sets - 10 reps ?- Seated Hip Adduction Isometrics with Ball  - 1 x daily - 4 x weekly - 3 sets - 10 reps - 5 hold ? ? ? ? PT Short Term Goals   ? ?  ? PT SHORT TERM GOAL #1  ? Title Patient will demonstrate understanding of HEP for improved self-management of rehab.   ? Baseline 03/24/21: NT   ? Time 2   ? Period Weeks   ? ?  ?  ? ?  ? ? ? PT Long Term Goals  ? ?  ? PT LONG TERM GOAL #1  ? Title Patient will have improved function and activity level as evidenced by an increase in FOTO score by 10 points or more.   ? Baseline 03/24/21: 53/62   ? Time 8   ? Period Weeks   ? Status On-going   ? Target Date 05/19/21   ?  ? PT LONG TERM GOAL #2  ? Title Patient will improve  knee flexion AROM to >=130 degrees and to improve knee extension AROM to <=0 degrees to normalize gait and to be able to achieve cross legged posture.   ? Baseline 3/7: Knee Flex R/L 110/110 , Knee Ext R/L   ? Time 8   ? Period Weeks   ? Status On-going   ? Target Date 05/19/21   ? ?  ?  ? ?  ? ? ? Plan   ? ? Clinical Impression Statement Pt continues to complete all exercises without an increase in right or left knee pain. Her main limitations are bilateral hip abductor weakness along with excessive ankle pronation with right ankle causing severe pain that is limiting her ability to walk long distances. PT recommends that pt continue to ambulate and increase distance by 10% each week and to attempt to stand more to improve LE endurance. Focus of session on hip strengthening to improve hip stability during gait. PT recommends trialing shoes to reduce foot pronation, but pt does not feel comfortable in corrective shoes and only wants to use sandals or slippers.   ? Personal Factors and Comorbidities Age;Past/Current Experience;Time since onset of injury/illness/exacerbation   ? Examination-Activity Limitations Locomotion Level;Stand   ? Examination-Participation Restrictions Meal Prep   ? Stability/Clinical Decision Making Stable/Uncomplicated   ? Clinical Decision Making Low   ? Rehab Potential Fair   ? PT Frequency 2x / week   ? PT Treatment/Interventions Aquatic Therapy;Cryotherapy;Moist Heat;ADLs/Self Care Home Management;Gait training;Balance training;Neuromuscular re-education;Therapeutic exercise;DME Instruction;Manual techniques;Joint Manipulations;Spinal Manipulations;Passive range of motion;Dry needling;Patient/family education   ? PT Next Visit Plan Warm up on TM. Focus on dynamic and static balance exercises to strengthen hip and knee and ankle musculature while working on LE endurance.   ? PT Home Exercise Plan [email or web address]   ? Consulted and Agree with Plan of Care Patient;Family member/caregiver   ? ?  ?   ? ?  ? ? ? ?Bradly Chris PT, DPT ?Daneil Dan, PT ?04/13/2021, 3:41 PM ? ?  ? ?

## 2021-04-14 ENCOUNTER — Encounter: Payer: Medicare Other | Admitting: Physical Therapy

## 2021-04-15 ENCOUNTER — Encounter: Payer: Self-pay | Admitting: Physical Therapy

## 2021-04-21 ENCOUNTER — Ambulatory Visit: Payer: Medicare Other | Attending: Nurse Practitioner

## 2021-04-21 ENCOUNTER — Encounter: Payer: Self-pay | Admitting: Physical Therapy

## 2021-04-21 DIAGNOSIS — R262 Difficulty in walking, not elsewhere classified: Secondary | ICD-10-CM | POA: Diagnosis present

## 2021-04-21 DIAGNOSIS — M25561 Pain in right knee: Secondary | ICD-10-CM | POA: Insufficient documentation

## 2021-04-21 DIAGNOSIS — M25562 Pain in left knee: Secondary | ICD-10-CM | POA: Diagnosis present

## 2021-04-21 NOTE — Therapy (Signed)
?OUTPATIENT PHYSICAL THERAPY TREATMENT NOTE ? ? ?Patient Name: Alexis Mcpherson ?MRN: [id number] ?DOB:[birth year], 71 y.o., female ?Today's Date: 04/21/2021 ? ?PCP: Sallyanne Kuster, NP ?REFERRING PROVIDER: Sallyanne Kuster, NP ? ? PT End of Session - 04/21/21 1506   ? ? Visit Number 6   ? Number of Visits 16   ? Date for PT Re-Evaluation 05/19/21   ? Authorization Type Medicare   ? PT Start Time 1505   ? PT Stop Time 1544   ? PT Time Calculation (min) 39 min   ? Equipment Utilized During Treatment Gait belt   ? Activity Tolerance Patient tolerated treatment well   ? Behavior During Therapy [facility] for tasks assessed/performed   ? ?  ?  ? ?  ? ? ? ?Past Medical History:  ?Diagnosis Date  ? Known health problems: none   ? ?Past Surgical History:  ?Procedure Laterality Date  ? none    ? 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 ?There are no problems to display for this patient. ? ? ?REFERRING DIAG: Bilateral TKA  ? ?THERAPY DIAG:  ?Acute pain of right knee ? ?Acute pain of left knee ? ?Difficulty in walking, not elsewhere classified ? ?PERTINENT HISTORY: Patient received bilateral knee replacements in [place] on 02/14/21. She received home health PT in [place], and now she would like to continue PT in outpatient setting.  ? ?PRECAUTIONS: None  ? ?SUBJECTIVE: Pt reports not walking much due to her knee pain and ankle pain. Denies falls or any new concerns ? ?PAIN:  ?Are you having pain? Yes in B knees. 1-3/10 ranges via NPRS ? ? ? ? ?TODAY'S TREATMENT:  ? ?04/21/21:  ? ?There.ex:  ?  Nu-Step L1 for 5 min for B knee flexion and extension AROM  ? Side lying Hip Abduction. Significant limitations in form/technique with heavy anterior hip compensation.  ?  Standing hip abduction no resistance with BUE support: 3x8 BLE's. Mod multimodal cues for upright posture and eccentric control with limited carryover.  ? SLS with BUE support for glut med stability/strength: 3x15 sec/LE ? Heel raises with BUE support: 2x8  ? UE supported squats  with chair as tactile cue for improved hip hinge: 2x8. Good form/technique.  ? Seated hip add squeeze with green ball for resistance: 2x10, 5 sec holds.  ? Post tib biased seated heel raise: 2x6/LE with feet placed in inversion with towels on medial side of arches. ? Standing alternating hamstring curls: x8/LE with BUE support. ? ?Pt requires mod multimodal cuing for form/technique due to weakness leading to compensations. Frequent seated rest breaks required due to fatigue.  ? ?PATIENT EDUCATION: ?Education details: form and technique for appropriate exercise  ?Person educated: Patient ?Education method: Explanation ?Education comprehension: verbalized understanding, returned demonstration, and verbal cues required ? ? ?HOME EXERCISE PROGRAM: ?Access Code: [email or web address] ?URL: [email or web address] ?Date: 04/13/2021 ?Prepared by: Ellin Goodie ? ?Exercises ?- Seated Table Hamstring Stretch  - 1 x daily - 7 x weekly - 1 sets - 3 reps - 60 hold ?- Tibialis Posterior Stretch at Wall  - 1 x daily - 7 x weekly - 1 sets - 3 reps - 60 hold ?- Seated Calf Stretch with Strap  - 1 x daily - 7 x weekly - 1 sets - 3 reps - 60 hold ?- Sidelying Quadriceps Stretch with Strap  - 1 x daily - 7 x weekly - 1 sets - 3 reps - 60 hold ?- Sit to Stand  - 1 x daily -  4 x weekly - 3 sets - 10 reps ?- Good Morning with Resistance  - 1 x daily - 4 x weekly - 3 sets - 10 reps ?- Clamshell  - 1 x daily - 4 x weekly - 3 sets - 10 reps ?- Seated Hip Adduction Isometrics with Ball  - 1 x daily - 4 x weekly - 3 sets - 10 reps - 5 hold ? ? ? ? PT Short Term Goals   ? ?  ? PT SHORT TERM GOAL #1  ? Title Patient will demonstrate understanding of HEP for improved self-management of rehab.   ? Baseline 03/24/21: NT   ? Time 2   ? Period Weeks   ? ?  ?  ? ?  ? ? ? PT Long Term Goals  ? ?  ? PT LONG TERM GOAL #1  ? Title Patient will have improved function and activity level as evidenced by an increase in FOTO score by 10 points or more.   ?  Baseline 03/24/21: 53/62   ? Time 8   ? Period Weeks   ? Status On-going   ? Target Date 05/19/21   ?  ? PT LONG TERM GOAL #2  ? Title Patient will improve knee flexion AROM to >=130 degrees and to improve knee extension AROM to <=0 degrees to normalize gait and to be able to achieve cross legged posture.   ? Baseline 3/7: Knee Flex R/L 110/110 , Knee Ext R/L   ? Time 8   ? Period Weeks   ? Status On-going   ? Target Date 05/19/21   ? ?  ?  ? ?  ? ? Plan - 04/21/21 1525   ? ? Clinical Impression Statement Continuing PT POC with focus on proximal hip strengthening. Pt requires frequent, mod to max multimodal cuing throughot exercise due to muscular weakness leading to compensation. Frequent but brief seated rest breaks required due to muscular fatigue. Pt will continue to benefit from skilled PT services to progress strength for optimized gait mechanics and return to pain free ADL's.   ? Personal Factors and Comorbidities Age;Past/Current Experience;Time since onset of injury/illness/exacerbation   ? Examination-Activity Limitations Locomotion Level;Stand   ? Examination-Participation Restrictions Meal Prep   ? Stability/Clinical Decision Making Stable/Uncomplicated   ? Rehab Potential Fair   ? PT Frequency 2x / week   ? PT Treatment/Interventions Aquatic Therapy;Cryotherapy;Moist Heat;ADLs/Self Care Home Management;Gait training;Balance training;Neuromuscular re-education;Therapeutic exercise;DME Instruction;Manual techniques;Joint Manipulations;Spinal Manipulations;Passive range of motion;Dry needling;Patient/family education   ? PT Next Visit Plan Assess knee joint play. Attempt knee mobilizations. Continue with glute and abdominal strengthening. Discuss use of shoes for ankle   ? PT Home Exercise Plan [email or web address]   ? Consulted and Agree with Plan of Care Patient   ? ?  ?  ? ?  ?  ?Delphia Grates. Fairly IV, PT, DPT ?Physical Therapist- [place]  ?[facility]  ?04/21/2021, 4:07 PM ?

## 2021-04-23 ENCOUNTER — Encounter: Payer: Self-pay | Admitting: Physical Therapy

## 2021-04-23 ENCOUNTER — Ambulatory Visit: Payer: Medicare Other

## 2021-04-23 DIAGNOSIS — R262 Difficulty in walking, not elsewhere classified: Secondary | ICD-10-CM

## 2021-04-23 DIAGNOSIS — M25561 Pain in right knee: Secondary | ICD-10-CM | POA: Diagnosis not present

## 2021-04-23 DIAGNOSIS — M25562 Pain in left knee: Secondary | ICD-10-CM

## 2021-04-23 NOTE — Therapy (Signed)
?OUTPATIENT PHYSICAL THERAPY TREATMENT NOTE ? ? ?Patient Name: Alexis Mcpherson ?MRN: [id number] ?DOB:07-12-1950, 71 y.o., female ?Today's Date: 04/23/2021 ? ?PCP: Sallyanne Kuster, NP ?REFERRING PROVIDER: Sallyanne Kuster, NP ? ? PT End of Session - 04/23/21 1515   ? ? Visit Number 7   ? Number of Visits 16   ? Date for PT Re-Evaluation 05/19/21   ? Authorization Type Medicare   ? PT Start Time 1421   ? PT Stop Time 1505   ? PT Time Calculation (min) 44 min   ? Equipment Utilized During Treatment Gait belt   ? Activity Tolerance Patient tolerated treatment well   ? Behavior During Therapy [facility] for tasks assessed/performed   ? ?  ?  ? ?  ? ? ? ? ?Past Medical History:  ?Diagnosis Date  ? Known health problems: none   ? ?Past Surgical History:  ?Procedure Laterality Date  ? none    ? 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 ?There are no problems to display for this patient. ? ? ?REFERRING DIAG: Bilateral TKA  ? ?THERAPY DIAG:  ?Acute pain of right knee ? ?Acute pain of left knee ? ?Difficulty in walking, not elsewhere classified ? ?PERTINENT HISTORY: Patient received bilateral knee replacements in [place] on 02/14/21. She received home health PT in [place], and now she would like to continue PT in outpatient setting.  ? ?PRECAUTIONS: None  ? ?SUBJECTIVE: Pt denies any knee pain currently. Only R ankle hurts. Reports muscular fatigue after last session but states she feels better in her knees with last session and with exercises this session. ? ?PAIN:  ?Are you having pain? Yes in R ankle. Does not give numeric value. ? ? ? ? ?TODAY'S TREATMENT:  ? ?04/23/21:  ?Nu Step L3 for 5 min at seat 4 for increasing knee flexion AROM  ? ?Knee AROM Re-assessment:  ? R flex: 101 deg ? R extension: 2 deg ? L flex: 110 deg ? L extension: 2 deg ? ?Seated blue med ball deep knee flexion to end range: 2x20, 3 sec holds/ LE ? ?Contract/relax: 2x4/LE 8 sec holds alternating various phases of knee extension to improve knee flexion AROM then  various phases of knee flexion to improve knee extension AROM ? ?Mini Squats:  ? 2x8, BUE support, min multimodal cues for hip hinge. L lateral lean with squat despite max TC's and VC's for upright posture. ? ? ?Knee AROM post intervention: ? R knee flex: 104 deg ? R knee ext: 1 deg  ? L knee flex:110 deg ? L knee ext: 0 deg ? ?Education provided on consistent heel strike with initial contact to encourage TKE and stabilization of knee during stance phase which may in turn give relief to R ankle pain.  ? ?PATIENT EDUCATION: ?Education details: form and technique for appropriate exercise  ?Person educated: Patient ?Education method: Explanation ?Education comprehension: verbalized understanding, returned demonstration, and verbal cues required ? ? ?HOME EXERCISE PROGRAM: ?Access Code: [email or web address] ?URL: [email or web address] ?Date: 04/13/2021 ?Prepared by: Ellin Goodie ? ?Exercises ?- Seated Table Hamstring Stretch  - 1 x daily - 7 x weekly - 1 sets - 3 reps - 60 hold ?- Tibialis Posterior Stretch at Wall  - 1 x daily - 7 x weekly - 1 sets - 3 reps - 60 hold ?- Seated Calf Stretch with Strap  - 1 x daily - 7 x weekly - 1 sets - 3 reps - 60 hold ?- Sidelying Quadriceps Stretch with Strap  -  1 x daily - 7 x weekly - 1 sets - 3 reps - 60 hold ?- Sit to Stand  - 1 x daily - 4 x weekly - 3 sets - 10 reps ?- Good Morning with Resistance  - 1 x daily - 4 x weekly - 3 sets - 10 reps ?- Clamshell  - 1 x daily - 4 x weekly - 3 sets - 10 reps ?- Seated Hip Adduction Isometrics with Ball  - 1 x daily - 4 x weekly - 3 sets - 10 reps - 5 hold ? ? ? ? PT Short Term Goals   ? ?  ? PT SHORT TERM GOAL #1  ? Title Patient will demonstrate understanding of HEP for improved self-management of rehab.   ? Baseline 03/24/21: NT   ? Time 2   ? Period Weeks   ? ?  ?  ? ?  ? ? ? PT Long Term Goals  ? ?  ? PT LONG TERM GOAL #1  ? Title Patient will have improved function and activity level as evidenced by an increase in FOTO score by  10 points or more.   ? Baseline 03/24/21: 53/62   ? Time 8   ? Period Weeks   ? Status On-going   ? Target Date 05/19/21   ?  ? PT LONG TERM GOAL #2  ? Title Patient will improve knee flexion AROM to >=130 degrees and to improve knee extension AROM to <=0 degrees to normalize gait and to be able to achieve cross legged posture.   ? Baseline 3/7: Knee Flex R/L 110/110 , Knee Ext R/L   ? Time 8   ? Period Weeks   ? Status On-going   ? Target Date 05/19/21   ? ?  ?  ? ?  ? ? Plan - 04/23/21 1516   ? ? Clinical Impression Statement Re-measurement perfomed on R/L AROM. Pt limited in B knees in flexion and extension R > L. Focus of session on techniques to improve AROM for improved ankle pain and gait mechanics. Post session pt able to reach 0 deg knee extension on L knee and 1 deg on R knee with improvement in R knee flexion by 4 deg. Pt would benefit from future sessions incorporating AROM to maximize AROM gains for gait mechanics in hopes to decrease load on R ankle pain. Pt will continue to benefit from skilled PT services to progress B knee mobility and global LE strengthening to return to normalized gait mechanics and pain freed ADL completion.   ? Personal Factors and Comorbidities Age;Past/Current Experience;Time since onset of injury/illness/exacerbation   ? Examination-Activity Limitations Locomotion Level;Stand   ? Examination-Participation Restrictions Meal Prep   ? Stability/Clinical Decision Making Stable/Uncomplicated   ? Rehab Potential Fair   ? PT Frequency 2x / week   ? PT Treatment/Interventions Aquatic Therapy;Cryotherapy;Moist Heat;ADLs/Self Care Home Management;Gait training;Balance training;Neuromuscular re-education;Therapeutic exercise;DME Instruction;Manual techniques;Joint Manipulations;Spinal Manipulations;Passive range of motion;Dry needling;Patient/family education   ? PT Next Visit Plan Add in Knee AROM exercises   ? PT Home Exercise Plan [email or web address]   ? Consulted and Agree with Plan of Care  Patient   ? ?  ?  ? ?  ? ?  ?Delphia Grates. Fairly IV, PT, DPT ?Physical Therapist- [place]  ?[facility]  ?04/23/2021, 3:21 PM ?

## 2021-04-27 ENCOUNTER — Ambulatory Visit: Payer: Medicare Other | Admitting: Physical Therapy

## 2021-04-27 ENCOUNTER — Encounter: Payer: Self-pay | Admitting: Physical Therapy

## 2021-04-27 DIAGNOSIS — M25562 Pain in left knee: Secondary | ICD-10-CM

## 2021-04-27 DIAGNOSIS — R262 Difficulty in walking, not elsewhere classified: Secondary | ICD-10-CM

## 2021-04-27 DIAGNOSIS — M25561 Pain in right knee: Secondary | ICD-10-CM | POA: Diagnosis not present

## 2021-04-27 NOTE — Therapy (Signed)
?OUTPATIENT PHYSICAL THERAPY TREATMENT NOTE ? ? ?Patient Name: Alexis Mcpherson ?MRN: [id number] ?DOB:[birth year], 71 y.o., female ?Today's Date: 04/28/2021 ? ?PCP: Sallyanne Kuster, NP ?REFERRING PROVIDER: Sallyanne Kuster, NP ? ? PT End of Session - 04/27/21 1553   ? ? Visit Number 8   ? Number of Visits 16   ? Date for PT Re-Evaluation 05/19/21   ? Authorization Type Medicare   ? PT Start Time 1550   ? PT Stop Time 1630   ? PT Time Calculation (min) 40 min   ? Equipment Utilized During Treatment Gait belt   ? Activity Tolerance Patient tolerated treatment well   ? Behavior During Therapy [facility] for tasks assessed/performed   ? ?  ?  ? ?  ? ? ? ? ?Past Medical History:  ?Diagnosis Date  ? Known health problems: none   ? ?Past Surgical History:  ?Procedure Laterality Date  ? none    ? 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 ?There are no problems to display for this patient. ? ? ?REFERRING DIAG: Bilateral TKA  ? ?THERAPY DIAG:  ?Acute pain of right knee ? ?Acute pain of left knee ? ?Difficulty in walking, not elsewhere classified ? ?PERTINENT HISTORY: Patient received bilateral knee replacements in [place] on 02/14/21. She received home health PT in [place], and now she would like to continue PT in outpatient setting.  ? ?PRECAUTIONS: None  ? ?SUBJECTIVE: Pt states that she continues to feel increased stiffness in her legs and increased pain in her left ankle. She struggles with going up her stairs and needs to use both railings.  ? ?PAIN:  ?Are you having pain? Yes in R ankle. 6/10 NPS  ? ? ? ? ?TODAY'S TREATMENT:  ?04/27/21: ? ?THEREX ? ?Nu-Step Seat Level 8  ?                       Supine HS Stretch 3 x 60 sec  ?                       Prone Quad Stretch 3 x 60 sec  ?                       Supine Glute Stretch 3 x 60 sec  ?                      -Terminated due to increase knee pain  ?Front Step Ups onto 1 ft platform 3 x 10 with 1 UE support  ?Side Step Ups onto 1 ft platform with BUE support 3 x 10  ? ?04/23/21:  ?Nu  Step L3 for 5 min at seat 4 for increasing knee flexion AROM  ? ?Knee AROM Re-assessment:  ? R flex: 101 deg ? R extension: 2 deg ? L flex: 110 deg ? L extension: 2 deg ? ?Seated blue med ball deep knee flexion to end range: 2x20, 3 sec holds/ LE ? ?Contract/relax: 2x4/LE 8 sec holds alternating various phases of knee extension to improve knee flexion AROM then various phases of knee flexion to improve knee extension AROM ? ?Mini Squats:  ? 2x8, BUE support, min multimodal cues for hip hinge. L lateral lean with squat despite max TC's and VC's for upright posture. ? ? ?Knee AROM post intervention: ? R knee flex: 104 deg ? R knee ext: 1 deg  ? L knee flex:110 deg ? L knee  ext: 0 deg ? ?Education provided on consistent heel strike with initial contact to encourage TKE and stabilization of knee during stance phase which may in turn give relief to R ankle pain.  ? ?PATIENT EDUCATION: ?Education details: form and technique for appropriate exercise  ?Person educated: Patient ?Education method: Explanation ?Education comprehension: verbalized understanding, returned demonstration, and verbal cues required ? ? ?HOME EXERCISE PROGRAM: ?Access Code: [email or web address] ?URL: [email or web address] ?Date: 04/27/2021 ?Prepared by: Ellin Goodie ? ?Exercises ?- Seated Table Hamstring Stretch  - 1 x daily - 7 x weekly - 1 sets - 3 reps - 60 hold ?- Tibialis Posterior Stretch at Wall  - 1 x daily - 7 x weekly - 1 sets - 3 reps - 60 hold ?- Seated Calf Stretch with Strap  - 1 x daily - 7 x weekly - 1 sets - 3 reps - 60 hold ?- Sidelying Quadriceps Stretch with Strap  - 1 x daily - 7 x weekly - 1 sets - 3 reps - 60 hold ?- Sit to Stand  - 1 x daily - 4 x weekly - 3 sets - 10 reps ?- Good Morning with Resistance  - 1 x daily - 4 x weekly - 3 sets - 10 reps ?- Seated Hip Adduction Isometrics with Ball  - 1 x daily - 4 x weekly - 3 sets - 10 reps - 5 hold ?- Lateral Step Up with Counter Support  - 1 x daily - 3 x weekly - 3 sets -  10 reps ?- Forward Step Up with Unilateral Counter Support  - 1 x daily - 3 x weekly - 3 sets - 10 reps ? ? ? PT Short Term Goals   ? ?  ? PT SHORT TERM GOAL #1  ? Title Patient will demonstrate understanding of HEP for improved self-management of rehab.   ? Baseline 03/24/21: NT   ? Time 2   ? Period Weeks   ? ?  ?  ? ?  ? ? ? PT Long Term Goals  ? ?  ? PT LONG TERM GOAL #1  ? Title Patient will have improved function and activity level as evidenced by an increase in FOTO score by 10 points or more.   ? Baseline 03/24/21: 53/62   ? Time 8   ? Period Weeks   ? Status On-going   ? Target Date 05/19/21   ?  ? PT LONG TERM GOAL #2  ? Title Patient will improve knee flexion AROM to >=130 degrees and to improve knee extension AROM to <=0 degrees to normalize gait and to be able to achieve cross legged posture.   ? Baseline 3/7: Knee Flex R/L 110/110 , Knee Ext R/L   ? Time 8   ? Period Weeks   ? Status On-going   ? Target Date 05/19/21   ? ?  ?  ? ?  ? ? Plan - 04/23/21 1516   ? ? Clinical Impression Statement Pt improvement in knee and hip strength with ability to perform concentric exercises. 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 She continues to exhibit high motivation and follow through with HEP and she will continue to benefit with skilled PT.   ? Personal Factors and Comorbidities Age;Past/Current Experience;Time since onset of injury/illness/exacerbation   ? Examination-Activity Limitations Locomotion Level;Stand   ? Examination-Participation Restrictions Meal Prep   ? Stability/Clinical Decision Making Stable/Uncomplicated   ? Rehab Potential Fair   ?  PT Frequency 2x / week   ? PT Treatment/Interventions Aquatic Therapy;Cryotherapy;Moist Heat;ADLs/Self Care Home Management;Gait training;Balance training;Neuromuscular re-education;Therapeutic exercise;DME Instruction;Manual techniques;Joint Manipulations;Spinal Manipulations;Passive range of motion;Dry  needling;Patient/family education   ? PT Next Visit Plan Add in Knee AROM exercises   ? PT Home Exercise Plan [email or web address]   ? Consulted and Agree with Plan of Care Patient   ? ?  ?  ? ?  ? ?  ?Ellin Goodieaniel Kenly Xiao PT, DPT ?Physical Therapist- [place]  ?[facility]  ?04/28/2021, 5:47 PM ?

## 2021-04-28 ENCOUNTER — Encounter: Payer: Self-pay | Admitting: Nurse Practitioner

## 2021-04-28 ENCOUNTER — Other Ambulatory Visit: Payer: Self-pay

## 2021-04-28 ENCOUNTER — Ambulatory Visit (INDEPENDENT_AMBULATORY_CARE_PROVIDER_SITE_OTHER): Payer: Medicare Other | Admitting: Nurse Practitioner

## 2021-04-28 VITALS — BP 137/60 | HR 73 | Temp 98.6°F | Resp 16 | Ht 62.0 in | Wt 201.4 lb

## 2021-04-28 DIAGNOSIS — Z96653 Presence of artificial knee joint, bilateral: Secondary | ICD-10-CM | POA: Diagnosis not present

## 2021-04-28 DIAGNOSIS — R300 Dysuria: Secondary | ICD-10-CM | POA: Diagnosis not present

## 2021-04-28 DIAGNOSIS — Z1211 Encounter for screening for malignant neoplasm of colon: Secondary | ICD-10-CM

## 2021-04-28 DIAGNOSIS — Z0001 Encounter for general adult medical examination with abnormal findings: Secondary | ICD-10-CM

## 2021-04-28 DIAGNOSIS — I10 Essential (primary) hypertension: Secondary | ICD-10-CM | POA: Diagnosis not present

## 2021-04-28 DIAGNOSIS — Z1212 Encounter for screening for malignant neoplasm of rectum: Secondary | ICD-10-CM

## 2021-04-28 DIAGNOSIS — Z23 Encounter for immunization: Secondary | ICD-10-CM

## 2021-04-28 MED ORDER — PNEUMOCOCCAL 20-VAL CONJ VACC 0.5 ML IM SUSY: 0.5000 mL | PREFILLED_SYRINGE | INTRAMUSCULAR | 0 refills | Status: AC

## 2021-04-28 MED ORDER — TETANUS-DIPHTH-ACELL PERTUSSIS 5-2.5-18.5 LF-MCG/0.5 IM SUSP: 0.5000 mL | Freq: Once | INTRAMUSCULAR | 0 refills | Status: AC

## 2021-04-28 MED ORDER — ZOSTER VAC RECOMB ADJUVANTED 50 MCG/0.5ML IM SUSR: 0.5000 mL | Freq: Once | INTRAMUSCULAR | 0 refills | Status: AC

## 2021-04-28 NOTE — Progress Notes (Signed)
Gastroenterology Pre-Procedure Review ? ?Request Date: NA ?Requesting Physician: Dr. Lauro Regulus ? ?PATIENT REVIEW QUESTIONS: The patient responded to the following health history questions as indicated:   ? ?1. Are you having any GI issues? no ?2. Do you have a personal history of Polyps? no ?3. Do you have a family history of Colon Cancer or Polyps? no ?4. Diabetes Mellitus? no ?5. Joint replacements in the past 12 months?yes (2 MONTHS AGO BOTH KNEES) ?6. Major health problems in the past 3 months?no ?7. Any artificial heart valves, MVP, or defibrillator?no ?   ?MEDICATIONS & ALLERGIES:    ?Patient reports the following regarding taking any anticoagulation/antiplatelet therapy:   ?Plavix, Coumadin, Eliquis, Xarelto, Lovenox, Pradaxa, Brilinta, or Effient? no ?Aspirin? no ? ?Patient confirms/reports the following medications:  ?Current Outpatient Medications  ?Medication Sig Dispense Refill  ? pneumococcal 20-valent conjugate vaccine (PREVNAR 20) 0.5 ML injection Inject 0.5 mLs into the muscle tomorrow at 10 am for 1 dose. 0.5 mL 0  ? Tdap (BOOSTRIX) 5-2.5-18.5 LF-MCG/0.5 injection Inject 0.5 mLs into the muscle once for 1 dose. 0.5 mL 0  ? Zoster Vaccine Adjuvanted [facility]) injection Inject 0.5 mLs into the muscle once for 1 dose. 0.5 mL 0  ? ?No current facility-administered medications for this visit.  ? ? ?Patient confirms/reports the following allergies:  ?No Known Allergies ? ?No orders of the defined types were placed in this encounter. ? ? ?AUTHORIZATION INFORMATION ?Primary Insurance: ?1D#: ?Group #: ? ?Secondary Insurance: ?1D#: ?Group #: ? ?SCHEDULE INFORMATION: ?Date:  ?NA ? ?Time: ?Location:NA ? ?

## 2021-04-28 NOTE — Progress Notes (Signed)
[facility] ?[address] ?[place], [place] [address] ? ?Internal MEDICINE  ?Office Visit Note ? ?Patient Name: Alexis LassoMrudula Mcpherson ? [id number]  ?[id number] ? ?Date of Service: 04/28/2021 ? ?Chief Complaint  ?Patient presents with  ? Medicare Wellness  ? ? ?HPI ?Alexis EstimableMrudula presents for an annual well visit and physical exam.  She is a well-appearing 71 year old female with no major medical conditions except she did have recent bilateral total knee replacement that was done in [place].  She had a virtual visit previously to get her set up with physical therapy in the area and has continued to do physical therapy for the past couple of months.  Her family member who accompanied her to the office visit today states that she is doing well and her physical therapy.  She does have bilateral ankle pain and swelling and may need additional referral for physical therapy for the ankles specifically. ?She is due for routine colonoscopy and recommended vaccinations ?Her lab results were reviewed during the office visit with the patient and her family member. Her vitamin D level is good at 64, her B12 level was low. The rest of the labs were grossly normal.  ? ? ? ? ?Current Medication: ?Outpatient Encounter Medications as of 04/28/2021  ?Medication Sig  ? [EXPIRED] pneumococcal 20-valent conjugate vaccine (PREVNAR 20) 0.5 ML injection Inject 0.5 mLs into the muscle tomorrow at 10 am for 1 dose.  ? [EXPIRED] Tdap (BOOSTRIX) 5-2.5-18.5 LF-MCG/0.5 injection Inject 0.5 mLs into the muscle once for 1 dose.  ? [DISCONTINUED] Zoster Vaccine Adjuvanted [facility](SHINGRIX) injection Inject 0.5 mLs into the muscle once.  ? [EXPIRED] Zoster Vaccine Adjuvanted [facility](SHINGRIX) injection Inject 0.5 mLs into the muscle once for 1 dose.  ? ?No facility-administered encounter medications on file as of 04/28/2021.  ? ? ?Surgical History: ?Past Surgical History:  ?Procedure Laterality Date  ? none    ? 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 ? ?Medical  History: ?Past Medical History:  ?Diagnosis Date  ? Known health problems: none   ? ? ?Family History: ?Family History  ?Problem Relation Age of Onset  ? Ulcers Mother   ? ? ?Social History  ? ?Socioeconomic History  ? Marital status: Married  ?  Spouse name: Not on file  ? Number of children: Not on file  ? Years of education: Not on file  ? Highest education level: Not on file  ?Occupational History  ? Not on file  ?Tobacco Use  ? Smoking status: Never  ? Smokeless tobacco: Never  ?Substance and Sexual Activity  ? Alcohol use: Not Currently  ? Drug use: Not Currently  ? Sexual activity: Not on file  ?Other Topics Concern  ? Not on file  ?Social History Narrative  ? Not on file  ? ?Social Determinants of Health  ? ?Financial Resource Strain: Not on file  ?Food Insecurity: Not on file  ?Transportation Needs: Not on file  ?Physical Activity: Not on file  ?Stress: Not on file  ?Social Connections: Not on file  ?Intimate Partner Violence: Not on file  ? ? ? ? ?Review of Systems  ?Constitutional:  Negative for chills, fatigue and unexpected weight change.  ?HENT:  Negative for congestion, rhinorrhea, sneezing and sore throat.   ?Eyes:  Negative for redness.  ?Respiratory:  Negative for cough, chest tightness and shortness of breath.   ?Cardiovascular:  Negative for chest pain and palpitations.  ?Gastrointestinal:  Negative for abdominal pain, constipation, diarrhea, nausea and vomiting.  ?Genitourinary:  Negative for dysuria and frequency.  ?Musculoskeletal:  Positive for arthralgias. Negative for back pain, joint swelling and neck pain.  ?Skin:  Negative for rash.  ?Neurological: Negative.  Negative for tremors and numbness.  ?Hematological:  Negative for adenopathy. Does not bruise/bleed easily.  ?Psychiatric/Behavioral:  Negative for behavioral problems (Depression), sleep disturbance and suicidal ideas. The patient is not nervous/anxious.   ? ?Vital Signs: ?BP 137/60   Pulse 73   Temp 98.6 ?F (37 ?C)   Resp 16    Ht 5\' 2"  (1.575 m)   Wt 201 lb 6.4 oz (91.4 kg)   SpO2 98%   BMI 36.84 kg/m?  ? ? ?Physical Exam ?Vitals reviewed.  ?Constitutional:   ?   General: She is awake. She is not in acute distress. ?   Appearance: Normal appearance. She is well-developed and well-groomed. She is obese. She is not ill-appearing or diaphoretic.  ?HENT:  ?   Head: Normocephalic and atraumatic.  ?   Right Ear: Tympanic membrane, ear canal and external ear normal.  ?   Left Ear: Tympanic membrane, ear canal and external ear normal.  ?   Nose: Nose normal. No congestion or rhinorrhea.  ?   Mouth/Throat:  ?   Lips: Pink.  ?   Mouth: Mucous membranes are moist.  ?   Pharynx: Oropharynx is clear. Uvula midline. No oropharyngeal exudate or posterior oropharyngeal erythema.  ?Eyes:  ?   General: Lids are normal. Vision grossly intact. Gaze aligned appropriately. No scleral icterus.    ?   Right eye: No discharge.     ?   Left eye: No discharge.  ?   Extraocular Movements: Extraocular movements intact.  ?   Conjunctiva/sclera: Conjunctivae normal.  ?   Pupils: Pupils are equal, round, and reactive to light.  ?Neck:  ?   Thyroid: No thyromegaly.  ?   Vascular: No JVD.  ?   Trachea: No tracheal deviation.  ?Cardiovascular:  ?   Rate and Rhythm: Normal rate and regular rhythm.  ?   Heart sounds: Normal heart sounds, S1 normal and S2 normal. No murmur heard. ?  No friction rub. No gallop.  ?Pulmonary:  ?   Effort: Pulmonary effort is normal. No accessory muscle usage or respiratory distress.  ?   Breath sounds: Normal breath sounds and air entry. No stridor. No wheezing or rales.  ?Chest:  ?   Chest wall: No tenderness.  ?   Comments: Declined clinical breast exam ?Abdominal:  ?   General: Bowel sounds are normal. There is no distension.  ?   Palpations: Abdomen is soft. There is no mass.  ?   Tenderness: There is no abdominal tenderness. There is no guarding or rebound.  ?Musculoskeletal:     ?   General: No deformity. Normal range of motion.  ?    Cervical back: Normal range of motion and neck supple.  ?   Right lower leg: 1+ Pitting Edema present.  ?   Left lower leg: 1+ Pitting Edema present.  ?   Right ankle: Swelling present. Tenderness present.  ?   Left ankle: Swelling present. Tenderness present.  ?Lymphadenopathy:  ?   Cervical: No cervical adenopathy.  ?Skin: ?   General: Skin is warm and dry.  ?   Capillary Refill: Capillary refill takes less than 2 seconds.  ?   Coloration: Skin is not pale.  ?   Findings: No erythema or rash.  ?Neurological:  ?   Mental  Status: She is alert and oriented to person, place, and time.  ?   Cranial Nerves: No cranial nerve deficit.  ?   Motor: No abnormal muscle tone.  ?   Coordination: Coordination normal.  ?   Deep Tendon Reflexes: Reflexes are normal and symmetric.  ?Psychiatric:     ?   Mood and Affect: Mood normal.     ?   Behavior: Behavior normal. Behavior is cooperative.     ?   Thought Content: Thought content normal.     ?   Judgment: Judgment normal.  ? ? ? ? ? ?Assessment/Plan: ?1. Encounter for general adult medical examination with abnormal findings ?Age-appropriate preventive screenings and vaccinations discussed, annual physical exam completed. Routine labs for health maintenance were done in [place] and results were reviewed during office visit today. PHM updated.  ? ?2. Essential hypertension ?History of elevated BP, not on any medications, blood pressure is stable.  ? ?3. Dysuria ?Routine urinalysis done. ?- UA/M w/rflx Culture, Routine ? ?4. Screening for colorectal cancer ?Refer to GI for routine colonoscopy ?- Ambulatory referral to Gastroenterology ? ?5. Status post bilateral knee replacements ?Still working on physical therapy, doing well, recovering, walking and strength is improving. Physical therapy recommends that she has a rollator walker to use.  ? ?6. Need for vaccination ?- Zoster Vaccine Adjuvanted [facility]) injection; Inject 0.5 mLs into the muscle once for 1 dose.  Dispense: 0.5 mL;  Refill: 0 ?- pneumococcal 20-valent conjugate vaccine (PREVNAR 20) 0.5 ML injection; Inject 0.5 mLs into the muscle tomorrow at 10 am for 1 dose.  Dispense: 0.5 mL; Refill: 0 ?- Tdap (BOOSTRIX) 5-2.5-18.5 LF-MCG/

## 2021-04-28 NOTE — Progress Notes (Signed)
PATIENT WILL CALL us BACK TO FINISH SCHEDULING  ?

## 2021-04-29 LAB — UA/M W/RFLX CULTURE, ROUTINE
Bilirubin, UA: NEGATIVE
Glucose, UA: NEGATIVE
Ketones, UA: NEGATIVE
Leukocytes,UA: NEGATIVE
Nitrite, UA: NEGATIVE
Protein,UA: NEGATIVE
RBC, UA: NEGATIVE
Specific Gravity, UA: 1.021 (ref 1.005–1.030)
Urobilinogen, Ur: 0.2 mg/dL (ref 0.2–1.0)
pH, UA: 5 (ref 5.0–7.5)

## 2021-04-29 LAB — MICROSCOPIC EXAMINATION
Bacteria, UA: NONE SEEN
Casts: NONE SEEN /lpf
RBC, Urine: NONE SEEN /hpf (ref 0–2)
WBC, UA: NONE SEEN /hpf (ref 0–5)

## 2021-04-30 ENCOUNTER — Ambulatory Visit: Payer: Medicare Other | Admitting: Physical Therapy

## 2021-04-30 ENCOUNTER — Encounter: Payer: Self-pay | Admitting: Physical Therapy

## 2021-04-30 DIAGNOSIS — M25562 Pain in left knee: Secondary | ICD-10-CM

## 2021-04-30 DIAGNOSIS — M25561 Pain in right knee: Secondary | ICD-10-CM | POA: Diagnosis not present

## 2021-04-30 DIAGNOSIS — R262 Difficulty in walking, not elsewhere classified: Secondary | ICD-10-CM

## 2021-04-30 NOTE — Therapy (Signed)
?OUTPATIENT PHYSICAL THERAPY TREATMENT NOTE/ Discharge Summary  ? ? ?Patient Name: Alexis Mcpherson ?MRN: [id number] ?DOB:12-24-1950, 71 y.o., female ?Today's Date: 04/30/2021 ? ?PCP: Jonetta Osgood, NP ?REFERRING PROVIDER: Jonetta Osgood, NP ? ? PT End of Session - 04/30/21 1423   ? ? Visit Number 9   ? Number of Visits 16   ? Date for PT Re-Evaluation 05/19/21   ? Authorization Type Medicare   ? PT Start Time 1420   ? PT Stop Time 1500   ? PT Time Calculation (min) 40 min   ? Equipment Utilized During Treatment Gait belt   ? Activity Tolerance Patient tolerated treatment well   ? Behavior During Therapy [facility] for tasks assessed/performed   ? ?  ?  ? ?  ? ? ? ? ?Past Medical History:  ?Diagnosis Date  ? Known health problems: none   ? ?Past Surgical History:  ?Procedure Laterality Date  ? none    ? 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 ?There are no problems to display for this patient. ? ? ?REFERRING DIAG: Bilateral TKA  ? ?THERAPY DIAG:  ?Acute pain of right knee ? ?Acute pain of left knee ? ?Difficulty in walking, not elsewhere classified ? ?PERTINENT HISTORY: Patient received bilateral knee replacements in [place] on 02/14/21. She received home health PT in [place], and now she would like to continue PT in outpatient setting.  ? ?PRECAUTIONS: None  ? ?SUBJECTIVE: Pt reports that she is able to stand longer than she did before surgery and she started to cook.  ? ?PAIN:  ?Are you having pain? Yes in R ankle. 6/10 NPS . No knee pain  ? ? ? ? ?TODAY'S TREATMENT:  ?04/30/21 ?Nu-Step Seat Level 8 and Arm 8 for 5 min  ? ?Knee AROM  ?-Knee Flex R/L 120/120 ?-Knee Ext R/L -5/-5  ? ?Review of HEP and how to progress exercises. ?-Introduction to full kneel to tall kneel to increase knee ROM and extensor strength ? ? ? ?04/27/21: ? ?[place] ? ?Nu-Step Seat Level 8  ?                       Supine HS Stretch 3 x 60 sec  ?                       Prone Quad Stretch 3 x 60 sec  ?                       Supine Glute Stretch 3 x  60 sec  ?                      -Terminated due to increase knee pain  ?Front Step Ups onto 1 ft platform 3 x 10 with 1 UE support  ?Side Step Ups onto 1 ft platform with BUE support 3 x 10  ? ?04/23/21:  ?Nu Step L3 for 5 min at seat 4 for increasing knee flexion AROM  ? ?Knee AROM Re-assessment:  ? R flex: 101 deg ? R extension: 2 deg ? L flex: 110 deg ? L extension: 2 deg ? ?Seated blue med ball deep knee flexion to end range: 2x20, 3 sec holds/ LE ? ?Contract/relax: 2x4/LE 8 sec holds alternating various phases of knee extension to improve knee flexion AROM then various phases of knee flexion to improve knee extension AROM ? ?Mini Squats:  ? 2x8, BUE support, min  multimodal cues for hip hinge. L lateral lean with squat despite max TC's and VC's for upright posture. ? ? ?Knee AROM post intervention: ? R knee flex: 104 deg ? R knee ext: 1 deg  ? L knee flex:110 deg ? L knee ext: 0 deg ? ?Education provided on consistent heel strike with initial contact to encourage TKE and stabilization of knee during stance phase which may in turn give relief to R ankle pain.  ? ?PATIENT EDUCATION: ?Education details: form and technique for appropriate exercise  ?Person educated: Patient ?Education method: Explanation ?Education comprehension: verbalized understanding, returned demonstration, and verbal cues required ? ? ?HOME EXERCISE PROGRAM: ?Access Code: [email or web address] ?URL: [email or web address] ?Date: 04/30/2021 ?Prepared by: Bradly Chris ? ?Exercises ?- Seated Table Hamstring Stretch  - 1 x daily - 7 x weekly - 1 sets - 3 reps - 60 hold ?- Seated Calf Stretch with Strap  - 1 x daily - 7 x weekly - 1 sets - 3 reps - 60 hold ?- Sidelying Quadriceps Stretch with Strap  - 1 x daily - 7 x weekly - 1 sets - 3 reps - 60 hold ?- Sit to Stand  - 1 x daily - 3 x weekly - 3 sets - 10 reps ?- Good Morning with Resistance  - 1 x daily - 3 x weekly - 3 sets - 10 reps ?- Lateral Step Up with Counter Support  - 1 x daily - 3 x  weekly - 3 sets - 10 reps ?- Forward Step Up with Unilateral Counter Support  - 1 x daily - 3 x weekly - 3 sets - 10 reps ?- Sidelying Hip Adduction  - 1 x daily - 3 x weekly - 3 sets - 10 reps ? ? ? PT Short Term Goals   ? ?  ? PT SHORT TERM GOAL #1  ? Title Patient will demonstrate understanding of HEP for improved self-management of rehab.   ? Baseline 03/24/21: NT 04/30/21: Able to demonstrate understanding  ? Time 2   ? Period Weeks   ? ?  ?  ? ?  ? ? ? PT Long Term Goals  ? ?  ? PT LONG TERM GOAL #1  ? Title Patient will have improved function and activity level as evidenced by an increase in FOTO score by 10 points or more.   ? Baseline 03/24/21: 53/62 04/30/21: 63/62  ? Time 8   ? Period Weeks   ? Status MET   ? Target Date 05/19/21   ?  ? PT LONG TERM GOAL #2  ? Title Patient will improve knee flexion AROM to >=130 degrees and to improve knee extension AROM to <=0 degrees to normalize gait and to be able to achieve cross legged posture.   ? Baseline 3/7: Knee Flex R/L 110/110 , Knee Flex R/L 120/120   ? Time 8   ? Period Weeks   ? Status  Partially Met   ? Target Date 05/19/21   ? ?  ?  ? ?  ? ? Plan - 04/23/21 1516   ? ? Clinical Impression Statement Pt has nearly completed all her goals with exception of knee flexion ROM which she is 10 deg off. Despite limitations with knee ROM, pt reports increased function as evidenced from FOTO score and from subjective report that she is able to stand for longer periods of time to allow her to participate in cooking. PT contacted PCP to order rollator to enable pt ability to  ambulate for longer distances with increased stability. Pt is ready to discharge and she has been instructed on HEP to maintain functional gains that she can carry out independently at home.  ? Personal Factors and Comorbidities Age;Past/Current Experience;Time since onset of injury/illness/exacerbation   ? Examination-Activity Limitations Locomotion Level;Stand   ? Examination-Participation  Restrictions Meal Prep   ? Stability/Clinical Decision Making Stable/Uncomplicated   ? Rehab Potential Fair   ? PT Frequency 2x / week   ? PT Treatment/Interventions Aquatic Therapy;Cryotherapy;Moist Heat;ADLs/Self Care Home Management;Gait training;Balance training;Neuromuscular re-education;Therapeutic exercise;DME Instruction;Manual techniques;Joint Manipulations;Spinal Manipulations;Passive range of motion;Dry needling;Patient/family education   ? PT Next Visit Plan Add in Knee AROM exercises   ? PT Home Exercise Plan [email or web address]   ? Consulted and Agree with Plan of Care Patient   ? ?  ?  ? ?  ? ?  ?Bradly Chris PT, DPT ?Physical Therapist- [place]  ?[facility]  ?04/30/2021, 2:24 PM ?

## 2021-05-02 ENCOUNTER — Encounter: Payer: Self-pay | Admitting: Nurse Practitioner

## 2021-05-04 ENCOUNTER — Telehealth: Payer: Self-pay

## 2021-05-04 NOTE — Telephone Encounter (Signed)
Faxed [facility] [employer] for rollater  walker  ?

## 2021-05-05 ENCOUNTER — Encounter: Payer: Self-pay | Admitting: Nurse Practitioner

## 2021-05-05 ENCOUNTER — Encounter: Payer: Medicare Other | Admitting: Physical Therapy

## 2021-05-06 ENCOUNTER — Encounter: Payer: Self-pay | Admitting: Nurse Practitioner

## 2021-05-06 ENCOUNTER — Ambulatory Visit: Payer: Medicare Other | Admitting: Nurse Practitioner

## 2021-05-06 VITALS — BP 133/66 | HR 69 | Temp 97.6°F | Resp 16 | Ht 62.0 in | Wt 198.6 lb

## 2021-05-06 DIAGNOSIS — L0100 Impetigo, unspecified: Secondary | ICD-10-CM

## 2021-05-06 DIAGNOSIS — L2389 Allergic contact dermatitis due to other agents: Secondary | ICD-10-CM

## 2021-05-06 MED ORDER — PREDNISONE 50 MG PO TABS: 50.0000 mg | ORAL_TABLET | Freq: Every day | ORAL | 0 refills | Status: AC

## 2021-05-06 MED ORDER — MUPIROCIN 2 % EX OINT: 1.0000 "application " | TOPICAL_OINTMENT | Freq: Two times a day (BID) | CUTANEOUS | 0 refills | Status: AC

## 2021-05-06 NOTE — Progress Notes (Signed)
[facility] ?[address] ?[place], [place] [address] ? ?Internal MEDICINE  ?Office Visit Note ? ?Patient Name: Dezirea Mcpherson ? [id number]  ?[id number] ? ?Date of Service: 05/06/2021 ? ?Chief Complaint  ?Patient presents with  ? Allergic Reaction  ?  Started on Sunday - itchy and hot, Benadryl did not help  ? ? ? ?HPI ?Alexis Mcpherson presents for an acute sick visit for swollen red skin on nose and on cheeks under her eyes.  She also has 2 spots, 1 at the top of her forehead in her hairline and the other is on the left temple above the left eye.  These two spots are bumps that have popped up and the patient has scratched them and they have opened and become infected.  She reports that the red itchy skin on her nose and cheek started on Sunday and the area feels swollen as well.  She has taken 1 dose of over-the-counter Benadryl and nothing else.  The 2 infected spots on her forehead started a couple of weeks ago. ?Over the past weekend the patient's daughter reported that she developed a cold and it is typical that she shows symptoms of redness and swelling in her nose but she does not typically have itchiness or swelling on the cheeks under the eyes. ? ? ? ?Current Medication: ? ?Outpatient Encounter Medications as of 05/06/2021  ?Medication Sig  ? mupirocin ointment (BACTROBAN) 2 % Apply 1 application. topically 2 (two) times daily. To affected area on forehead and temple until healed.  ? predniSONE (DELTASONE) 50 MG tablet Take 1 tablet (50 mg total) by mouth daily with breakfast.  ? [DISCONTINUED] Cephalexin 500 MG tablet Take 500 mg by mouth every 8 (eight) hours.  ? ?No facility-administered encounter medications on file as of 05/06/2021.  ? ? ? ? ?Medical History: ?Past Medical History:  ?Diagnosis Date  ? Known health problems: none   ? ? ? ?Vital Signs: ?BP 133/66   Pulse 69   Temp 97.6 ?F (36.4 ?C)   Resp 16   Ht 5\' 2"  (1.575 m)   Wt 198 lb 9.6 oz (90.1 kg)   SpO2 99%   BMI 36.32 kg/m?  ? ? ?Review  of Systems  ?Constitutional:  Negative for chills, diaphoresis and fatigue.  ?HENT:  Negative for ear pain, postnasal drip and sinus pressure.   ?     Right sided facial swelling and redness  ?Eyes:  Negative for photophobia, discharge, redness, itching and visual disturbance.  ?Respiratory:  Negative for cough, shortness of breath and wheezing.   ?Cardiovascular:  Negative for chest pain, palpitations and leg swelling.  ?Gastrointestinal:  Negative for abdominal pain, constipation, diarrhea, nausea and vomiting.  ?Genitourinary:  Negative for dysuria and flank pain.  ?Musculoskeletal:  Negative for arthralgias, back pain, gait problem and neck pain.  ?Skin:  Negative for color change.  ?Allergic/Immunologic: Negative for environmental allergies and food allergies.  ?Neurological:  Negative for dizziness and headaches.  ?Hematological:  Does not bruise/bleed easily.  ?Psychiatric/Behavioral:  Negative for agitation, behavioral problems (depression) and hallucinations.   ? ?Physical Exam ?Vitals reviewed.  ?Constitutional:   ?   Appearance: Normal appearance. She is normal weight.  ?Cardiovascular:  ?   Rate and Rhythm: Normal rate and regular rhythm.  ?   Pulses: Normal pulses.  ?   Heart sounds: Normal heart sounds.  ?Pulmonary:  ?   Effort: Pulmonary effort is normal.  ?   Breath sounds: Normal breath sounds.  ?Abdominal:  ?  General: Abdomen is flat.  ?   Palpations: Abdomen is soft.  ?Musculoskeletal:     ?   General: Normal range of motion.  ?   Cervical back: Normal range of motion.  ?Skin: ?   General: Skin is warm.  ?   Comments: Bilateral facial cheeks, mild swelling and erythema under her eyes. 2 open spots that are erythematous, swollen and appear to look like a small cyst or abscess in her hairline on her forehead and 1 spot/bump that is not open on the right side of her face just below the right temple.   ?Neurological:  ?   General: No focal deficit present.  ?   Mental Status: She is alert and  oriented to person, place, and time. Mental status is at baseline.  ?Psychiatric:     ?   Mood and Affect: Mood normal.     ?   Behavior: Behavior normal.     ?   Thought Content: Thought content normal.     ?   Judgment: Judgment normal.  ? ? ? ? ?Assessment/Plan: ?1. Allergic contact dermatitis due to other agents ?3 day course of prednisone prescribed. And may use OTC hydrocortisone as needed in small amounts to affected area. May also take 25 mg of benadryl every 6 hours for itching.  ?- predniSONE (DELTASONE) 50 MG tablet; Take 1 tablet (50 mg total) by mouth daily with breakfast.  Dispense: 3 tablet; Refill: 0 ? ?2. Impetigo ?2-3 spots on her forehead that she has scratched at that look infected. Mupirocin ointment prescribed.  ?- mupirocin ointment (BACTROBAN) 2 %; Apply 1 application. topically 2 (two) times daily. To affected area on forehead and temple until healed.  Dispense: 30 g; Refill: 0 ? ? ?General Counseling: Alexis Mcpherson verbalizes understanding of the findings of todays visit and agrees with plan of treatment. I have discussed any further diagnostic evaluation that may be needed or ordered today. We also reviewed her medications today. she has been encouraged to call the office with any questions or concerns that should arise related to todays visit. ? ? ? ?Counseling: ? ? ? ?No orders of the defined types were placed in this encounter. ? ? ?Meds ordered this encounter  ?Medications  ? mupirocin ointment (BACTROBAN) 2 %  ?  Sig: Apply 1 application. topically 2 (two) times daily. To affected area on forehead and temple until healed.  ?  Dispense:  30 g  ?  Refill:  0  ? predniSONE (DELTASONE) 50 MG tablet  ?  Sig: Take 1 tablet (50 mg total) by mouth daily with breakfast.  ?  Dispense:  3 tablet  ?  Refill:  0  ? ? ?Return if symptoms worsen or fail to improve. ? ?[place] Controlled Substance Database was reviewed by me for overdose risk score (ORS) ? ?Time spent:20 Minutes ?Time spent with patient  included reviewing progress notes, labs, imaging studies, and discussing plan for follow up.  ? ?This patient was seen by Sallyanne Kuster, FNP-C in collaboration with Dr. Beverely Risen as a part of collaborative care agreement. ? ?Maliha Outten R. Tedd Sias, MSN, FNP-C ?Internal Medicine ?

## 2021-05-07 ENCOUNTER — Encounter: Payer: Medicare Other | Admitting: Physical Therapy

## 2021-05-12 ENCOUNTER — Encounter: Payer: Medicare Other | Admitting: Physical Therapy

## 2021-05-14 ENCOUNTER — Encounter: Payer: Medicare Other | Admitting: Physical Therapy

## 2021-05-23 ENCOUNTER — Encounter: Payer: Self-pay | Admitting: Nurse Practitioner

## 2022-02-02 ENCOUNTER — Ambulatory Visit: Payer: Medicare Other | Admitting: Nurse Practitioner

## 2022-03-08 ENCOUNTER — Telehealth: Payer: Self-pay | Admitting: Nurse Practitioner

## 2022-03-08 DIAGNOSIS — E782 Mixed hyperlipidemia: Secondary | ICD-10-CM

## 2022-03-08 DIAGNOSIS — Z0000 Encounter for general adult medical examination without abnormal findings: Secondary | ICD-10-CM

## 2022-03-08 DIAGNOSIS — E538 Deficiency of other specified B group vitamins: Secondary | ICD-10-CM

## 2022-03-08 DIAGNOSIS — E559 Vitamin D deficiency, unspecified: Secondary | ICD-10-CM

## 2022-03-10 NOTE — Telephone Encounter (Signed)
Error

## 2022-03-31 NOTE — Addendum Note (Signed)
Addended by: Jonetta Osgood on: 03/31/2022 04:27 PM   Modules accepted: Orders

## 2022-04-03 LAB — TSH+FREE T4
Free T4: 1.15 ng/dL (ref 0.82–1.77)
TSH: 6.13 u[IU]/mL — ABNORMAL HIGH (ref 0.450–4.500)

## 2022-04-03 LAB — CBC WITH DIFFERENTIAL/PLATELET
Basophils Absolute: 0 10*3/uL (ref 0.0–0.2)
Basos: 1 %
EOS (ABSOLUTE): 0.2 10*3/uL (ref 0.0–0.4)
Eos: 3 %
Hematocrit: 33 % — ABNORMAL LOW (ref 34.0–46.6)
Hemoglobin: 10 g/dL — ABNORMAL LOW (ref 11.1–15.9)
Immature Grans (Abs): 0 10*3/uL (ref 0.0–0.1)
Immature Granulocytes: 0 %
Lymphocytes Absolute: 2.7 10*3/uL (ref 0.7–3.1)
Lymphs: 40 %
MCH: 25.7 pg — ABNORMAL LOW (ref 26.6–33.0)
MCHC: 30.3 g/dL — ABNORMAL LOW (ref 31.5–35.7)
MCV: 85 fL (ref 79–97)
Monocytes Absolute: 0.6 10*3/uL (ref 0.1–0.9)
Monocytes: 10 %
Neutrophils Absolute: 3.1 10*3/uL (ref 1.4–7.0)
Neutrophils: 46 %
Platelets: 275 10*3/uL (ref 150–450)
RBC: 3.89 x10E6/uL (ref 3.77–5.28)
RDW: 13.6 % (ref 11.7–15.4)
WBC: 6.7 10*3/uL (ref 3.4–10.8)

## 2022-04-03 LAB — CMP14+EGFR
ALT: 14 IU/L (ref 0–32)
AST: 21 IU/L (ref 0–40)
Albumin/Globulin Ratio: 1.5 (ref 1.2–2.2)
Albumin: 3.8 g/dL — ABNORMAL LOW (ref 3.9–4.9)
Alkaline Phosphatase: 105 IU/L (ref 44–121)
BUN/Creatinine Ratio: 27 (ref 12–28)
BUN: 18 mg/dL (ref 8–27)
Bilirubin Total: 0.3 mg/dL (ref 0.0–1.2)
CO2: 24 mmol/L (ref 20–29)
Calcium: 8.9 mg/dL (ref 8.7–10.3)
Chloride: 106 mmol/L (ref 96–106)
Creatinine, Ser: 0.67 mg/dL (ref 0.57–1.00)
Globulin, Total: 2.6 g/dL (ref 1.5–4.5)
Glucose: 108 mg/dL — ABNORMAL HIGH (ref 70–99)
Potassium: 4.8 mmol/L (ref 3.5–5.2)
Sodium: 139 mmol/L (ref 134–144)
Total Protein: 6.4 g/dL (ref 6.0–8.5)
eGFR: 94 mL/min/{1.73_m2} (ref >59)

## 2022-04-03 LAB — B12 AND FOLATE PANEL
Folate: 6.8 ng/mL (ref >3.0)
Vitamin B-12: 219 pg/mL — ABNORMAL LOW (ref 232–1245)

## 2022-04-03 LAB — LIPID PANEL
Chol/HDL Ratio: 4.2 ratio (ref 0.0–4.4)
Cholesterol, Total: 173 mg/dL (ref 100–199)
HDL: 41 mg/dL (ref >39)
LDL Chol Calc (NIH): 118 mg/dL — ABNORMAL HIGH (ref 0–99)
Triglycerides: 73 mg/dL (ref 0–149)
VLDL Cholesterol Cal: 14 mg/dL (ref 5–40)

## 2022-04-03 LAB — VITAMIN D 25 HYDROXY (VIT D DEFICIENCY, FRACTURES): Vit D, 25-Hydroxy: 24.1 ng/mL — ABNORMAL LOW (ref 30.0–100.0)

## 2022-04-05 NOTE — Progress Notes (Signed)
Will discuss results at upcoming visit.

## 2022-05-04 ENCOUNTER — Ambulatory Visit (INDEPENDENT_AMBULATORY_CARE_PROVIDER_SITE_OTHER): Payer: Medicare Other | Admitting: Nurse Practitioner

## 2022-05-04 ENCOUNTER — Encounter: Payer: Self-pay | Admitting: Nurse Practitioner

## 2022-05-04 VITALS — BP 134/65 | HR 69 | Temp 98.2°F | Resp 16 | Ht 62.0 in | Wt 213.8 lb

## 2022-05-04 DIAGNOSIS — E782 Mixed hyperlipidemia: Secondary | ICD-10-CM

## 2022-05-04 DIAGNOSIS — Z0001 Encounter for general adult medical examination with abnormal findings: Secondary | ICD-10-CM

## 2022-05-04 DIAGNOSIS — Z1231 Encounter for screening mammogram for malignant neoplasm of breast: Secondary | ICD-10-CM | POA: Diagnosis not present

## 2022-05-04 DIAGNOSIS — Z1211 Encounter for screening for malignant neoplasm of colon: Secondary | ICD-10-CM

## 2022-05-04 DIAGNOSIS — R7301 Impaired fasting glucose: Secondary | ICD-10-CM

## 2022-05-04 DIAGNOSIS — Z1212 Encounter for screening for malignant neoplasm of rectum: Secondary | ICD-10-CM

## 2022-05-04 DIAGNOSIS — E559 Vitamin D deficiency, unspecified: Secondary | ICD-10-CM

## 2022-05-04 DIAGNOSIS — E538 Deficiency of other specified B group vitamins: Secondary | ICD-10-CM | POA: Diagnosis not present

## 2022-05-04 LAB — POCT GLYCOSYLATED HEMOGLOBIN (HGB A1C): Hemoglobin A1C: 6 % — AB (ref 4.0–5.6)

## 2022-05-04 MED ORDER — CYANOCOBALAMIN 1000 MCG/ML IJ SOLN
1000.0000 ug | Freq: Once | INTRAMUSCULAR | Status: AC
  Administered 2022-05-04: 1000 ug via INTRAMUSCULAR

## 2022-05-04 NOTE — Progress Notes (Signed)
[facility] [address] [place], [place] [address]  Internal MEDICINE  Office Visit Note  Patient Name: Alexis Mcpherson  [id number]  [id number]  Date of Service: 05/04/2022  Chief Complaint  Patient presents with   Medicare Wellness    Hearing loss    Heartburn    HPI [name] presents for an annual well visit and physical exam.  Well-appearing 72 y.o. female with osteoarthritis, low B12 level,  Routine CRC screening: none, opt for cologuard Routine mammogram: done in 2022, due now DEXA scan:done in 2022 Labs: due for routine labs New or worsening pain: none Other concerns: acid reflux, takes famotidine which helps       05/04/2022   11:08 AM 04/28/2021   11:26 AM 10/23/2019   10:51 AM  MMSE - Mini Mental State Exam  Orientation to time 5 5 5   Orientation to Place 5 5 5   Registration 3 3 3   Attention/ Calculation 5 5 5   Recall 3 3 3   Language- name 2 objects 2 2 2   Language- repeat 1 1 1   Language- follow 3 step command 3 3 3   Language- read & follow direction 1 1 1   Write a sentence 1 1 1   Copy design 1 1 1   Total score 30 30 30     Functional Status Survey: Is the patient deaf or have difficulty hearing?: Yes Does the patient have difficulty seeing, even when wearing glasses/contacts?: No Does the patient have difficulty concentrating, remembering, or making decisions?: No Does the patient have difficulty walking or climbing stairs?: Yes Does the patient have difficulty dressing or bathing?: No Does the patient have difficulty doing errands alone such as visiting a doctor's office or shopping?: No     10/23/2019   10:50 AM 11/15/2019   10:30 AM 03/17/2021    3:27 PM 04/28/2021   11:23 AM 05/04/2022   11:06 AM  Fall Risk  Falls in the past year? 0 0 0 0 0  Was there an injury with Fall?     0  Fall Risk Category Calculator     0  (RETIRED) Patient Fall Risk Level   Low fall risk Low fall risk   Patient at Risk for Falls Due to  No Fall Risks No  Fall Risks No Fall Risks No Fall Risks  Fall risk Follow up  Falls evaluation completed Falls evaluation completed Falls evaluation completed Falls evaluation completed       05/04/2022   11:06 AM  Depression screen PHQ 2/9  Decreased Interest 0  Down, Depressed, Hopeless 0  PHQ - 2 Score 0        No data to display            Current Medication: Outpatient Encounter Medications as of 05/04/2022  Medication Sig   mupirocin ointment (BACTROBAN) 2 % Apply 1 application. topically 2 (two) times daily. To affected area on forehead and temple until healed.   predniSONE (DELTASONE) 50 MG tablet Take 1 tablet (50 mg total) by mouth daily with breakfast.   [EXPIRED] cyanocobalamin (VITAMIN B12) injection 1,000 mcg    No facility-administered encounter medications on file as of 05/04/2022.    Surgical History: Past Surgical History:  Procedure Laterality Date   none     REPLACEMENT TOTAL KNEE BILATERAL  02/14/2021    Medical History: Past Medical History:  Diagnosis Date   Known health problems: none     Family History: Family History  Problem Relation Age of Onset  Ulcers Mother     Social History   Socioeconomic History   Marital status: Married    Spouse name: Not on file   Number of children: Not on file   Years of education: Not on file   Highest education level: Not on file  Occupational History   Not on file  Tobacco Use   Smoking status: Never   Smokeless tobacco: Never  Substance and Sexual Activity   Alcohol use: Not Currently   Drug use: Not Currently   Sexual activity: Not on file  Other Topics Concern   Not on file  Social History Narrative   Not on file   Social Determinants of Health   Financial Resource Strain: Not on file  Food Insecurity: Not on file  Transportation Needs: Not on file  Physical Activity: Not on file  Stress: Not on file  Social Connections: Not on file  Intimate Partner Violence: Not on file      Review of  Systems  Constitutional:  Negative for chills, fatigue and unexpected weight change.  HENT:  Negative for congestion, rhinorrhea, sneezing and sore throat.   Eyes:  Negative for redness.  Respiratory:  Negative for cough, chest tightness and shortness of breath.   Cardiovascular:  Negative for chest pain and palpitations.  Gastrointestinal:  Negative for abdominal pain, constipation, diarrhea, nausea and vomiting.  Genitourinary:  Negative for dysuria and frequency.  Musculoskeletal:  Positive for arthralgias. Negative for back pain, joint swelling and neck pain.  Skin:  Negative for rash.  Neurological: Negative.  Negative for tremors and numbness.  Hematological:  Negative for adenopathy. Does not bruise/bleed easily.  Psychiatric/Behavioral:  Negative for behavioral problems (Depression), sleep disturbance and suicidal ideas. The patient is not nervous/anxious.     Vital Signs: BP 134/65   Pulse 69   Temp 98.2 F (36.8 C)   Resp 16   Ht 5\' 2"  (1.575 m)   Wt 213 lb 12.8 oz (97 kg)   SpO2 98%   BMI 39.10 kg/m    Physical Exam Vitals reviewed.  Constitutional:      General: She is awake. She is not in acute distress.    Appearance: Normal appearance. She is well-developed and well-groomed. She is obese. She is not ill-appearing or diaphoretic.  HENT:     Head: Normocephalic and atraumatic.     Right Ear: Tympanic membrane, ear canal and external ear normal.     Left Ear: Tympanic membrane, ear canal and external ear normal.     Nose: Nose normal. No congestion or rhinorrhea.     Mouth/Throat:     Lips: Pink.     Mouth: Mucous membranes are moist.     Pharynx: Oropharynx is clear. Uvula midline. No oropharyngeal exudate or posterior oropharyngeal erythema.  Eyes:     General: Lids are normal. Vision grossly intact. Gaze aligned appropriately. No scleral icterus.       Right eye: No discharge.        Left eye: No discharge.     Extraocular Movements: Extraocular movements  intact.     Conjunctiva/sclera: Conjunctivae normal.     Pupils: Pupils are equal, round, and reactive to light.  Neck:     Thyroid: No thyromegaly.     Vascular: No JVD.     Trachea: No tracheal deviation.  Cardiovascular:     Rate and Rhythm: Normal rate and regular rhythm.     Heart sounds: Normal heart sounds, S1 normal and S2 normal.  No murmur heard.    No friction rub. No gallop.  Pulmonary:     Effort: Pulmonary effort is normal. No accessory muscle usage or respiratory distress.     Breath sounds: Normal breath sounds and air entry. No stridor. No wheezing or rales.  Chest:     Chest wall: No tenderness.     Comments: Declined clinical breast exam Abdominal:     General: Bowel sounds are normal. There is no distension.     Palpations: Abdomen is soft. There is no mass.     Tenderness: There is no abdominal tenderness. There is no guarding or rebound.  Musculoskeletal:        General: No deformity. Normal range of motion.     Cervical back: Normal range of motion and neck supple.     Right lower leg: 1+ Pitting Edema present.     Left lower leg: 1+ Pitting Edema present.     Right ankle: Swelling present. Tenderness present.     Left ankle: Swelling present. Tenderness present.  Lymphadenopathy:     Cervical: No cervical adenopathy.  Skin:    General: Skin is warm and dry.     Capillary Refill: Capillary refill takes less than 2 seconds.     Coloration: Skin is not pale.     Findings: No erythema or rash.  Neurological:     Mental Status: She is alert and oriented to person, place, and time.     Cranial Nerves: No cranial nerve deficit.     Motor: No abnormal muscle tone.     Coordination: Coordination normal.     Deep Tendon Reflexes: Reflexes are normal and symmetric.  Psychiatric:        Mood and Affect: Mood normal.        Behavior: Behavior normal. Behavior is cooperative.        Thought Content: Thought content normal.        Judgment: Judgment normal.         Assessment/Plan: 1. Encounter for routine adult health examination with abnormal findings Age-appropriate preventive screenings and vaccinations discussed, annual physical exam completed. Routine labs for health maintenance results reviewed with patient. PHM updated.   2. Impaired fasting glucose A1c elevated, patient is prediabetic  - POCT glycosylated hemoglobin (Hb A1C)  3. B12 deficiency B12 level is low, B12 injection administered in office today - cyanocobalamin (VITAMIN B12) injection 1,000 mcg  4. Encounter for screening mammogram for malignant neoplasm of breast Routine mammogram ordered - MM 3D SCREENING MAMMOGRAM BILATERAL BREAST; Future  5. Screening for colorectal cancer Cologaurd test ordered - Cologuard     General Counseling: Aija verbalizes understanding of the findings of todays visit and agrees with plan of treatment. I have discussed any further diagnostic evaluation that may be needed or ordered today. We also reviewed her medications today. she has been encouraged to call the office with any questions or concerns that should arise related to todays visit.    Orders Placed This Encounter  Procedures   MM 3D SCREENING MAMMOGRAM BILATERAL BREAST   Cologuard   POCT glycosylated hemoglobin (Hb A1C)    Meds ordered this encounter  Medications   cyanocobalamin (VITAMIN B12) injection 1,000 mcg    Return for nurse visit for weekly B12 inj x3 then monthly B12 inj x6, and f/u in 3 months with Sheleen Conchas re: diet.   Total time spent:30 Minutes Time spent includes review of chart, medications, test results, and follow up plan with the  patient.    Controlled Substance Database was reviewed by me.  This patient was seen by Sallyanne Kuster, FNP-C in collaboration with Dr. Beverely Risen as a part of collaborative care agreement.  Torion Hulgan R. Tedd Sias, MSN, FNP-C Internal medicine

## 2022-05-11 ENCOUNTER — Ambulatory Visit: Payer: Medicare Other

## 2022-05-13 ENCOUNTER — Ambulatory Visit (INDEPENDENT_AMBULATORY_CARE_PROVIDER_SITE_OTHER): Payer: Medicare Other

## 2022-05-13 ENCOUNTER — Other Ambulatory Visit: Payer: Self-pay

## 2022-05-13 DIAGNOSIS — E538 Deficiency of other specified B group vitamins: Secondary | ICD-10-CM

## 2022-05-13 MED ORDER — CYANOCOBALAMIN 1000 MCG/ML IJ SOLN: 1000.0000 ug | INTRAMUSCULAR | 6 refills | Status: DC

## 2022-05-13 MED ORDER — CYANOCOBALAMIN 1000 MCG/ML IJ SOLN
1000.0000 ug | Freq: Once | INTRAMUSCULAR | Status: AC
  Administered 2022-05-13: 1000 ug via INTRAMUSCULAR

## 2022-05-13 MED ORDER — "LUER LOCK SAFETY SYRINGES 25G X 5/8"" 3 ML MISC": 6 refills | Status: DC

## 2022-05-15 ENCOUNTER — Encounter: Payer: Self-pay | Admitting: Nurse Practitioner

## 2022-05-18 ENCOUNTER — Ambulatory Visit (INDEPENDENT_AMBULATORY_CARE_PROVIDER_SITE_OTHER): Payer: Medicare Other

## 2022-05-18 DIAGNOSIS — E538 Deficiency of other specified B group vitamins: Secondary | ICD-10-CM

## 2022-05-18 MED ORDER — CYANOCOBALAMIN 1000 MCG/ML IJ SOLN
1000.0000 ug | Freq: Once | INTRAMUSCULAR | Status: AC
Start: 2022-05-18 — End: 2022-05-18
  Administered 2022-05-18: 1000 ug via INTRAMUSCULAR

## 2022-06-01 ENCOUNTER — Ambulatory Visit: Payer: Medicare Other

## 2022-06-11 ENCOUNTER — Ambulatory Visit (INDEPENDENT_AMBULATORY_CARE_PROVIDER_SITE_OTHER): Payer: Medicare Other

## 2022-06-11 DIAGNOSIS — E538 Deficiency of other specified B group vitamins: Secondary | ICD-10-CM

## 2022-06-11 MED ORDER — CYANOCOBALAMIN 1000 MCG/ML IJ SOLN
1000.0000 ug | Freq: Once | INTRAMUSCULAR | Status: AC
Start: 2022-06-11 — End: 2022-06-11
  Administered 2022-06-11: 1000 ug via INTRAMUSCULAR

## 2022-08-03 ENCOUNTER — Ambulatory Visit: Payer: Medicare Other | Admitting: Nurse Practitioner

## 2022-10-25 ENCOUNTER — Ambulatory Visit: Payer: Medicare Other | Admitting: Nurse Practitioner

## 2023-05-02 ENCOUNTER — Telehealth: Payer: Self-pay | Admitting: Nurse Practitioner

## 2023-05-02 DIAGNOSIS — E559 Vitamin D deficiency, unspecified: Secondary | ICD-10-CM

## 2023-05-02 DIAGNOSIS — E538 Deficiency of other specified B group vitamins: Secondary | ICD-10-CM

## 2023-05-02 DIAGNOSIS — E039 Hypothyroidism, unspecified: Secondary | ICD-10-CM

## 2023-05-02 DIAGNOSIS — E782 Mixed hyperlipidemia: Secondary | ICD-10-CM

## 2023-05-02 DIAGNOSIS — Z0000 Encounter for general adult medical examination without abnormal findings: Secondary | ICD-10-CM

## 2023-05-02 DIAGNOSIS — R7301 Impaired fasting glucose: Secondary | ICD-10-CM

## 2023-05-03 NOTE — Telephone Encounter (Signed)
 Pt son notified labs ordered

## 2023-05-05 LAB — CMP14+EGFR
ALT: 15 IU/L (ref 0–32)
AST: 22 IU/L (ref 0–40)
Albumin: 3.6 g/dL — ABNORMAL LOW (ref 3.8–4.8)
Alkaline Phosphatase: 115 IU/L (ref 44–121)
BUN/Creatinine Ratio: 23 (ref 12–28)
BUN: 15 mg/dL (ref 8–27)
Bilirubin Total: 0.4 mg/dL (ref 0.0–1.2)
CO2: 22 mmol/L (ref 20–29)
Calcium: 9 mg/dL (ref 8.7–10.3)
Chloride: 105 mmol/L (ref 96–106)
Creatinine, Ser: 0.64 mg/dL (ref 0.57–1.00)
Globulin, Total: 2.9 g/dL (ref 1.5–4.5)
Glucose: 103 mg/dL — ABNORMAL HIGH (ref 70–99)
Potassium: 4.6 mmol/L (ref 3.5–5.2)
Sodium: 138 mmol/L (ref 134–144)
Total Protein: 6.5 g/dL (ref 6.0–8.5)
eGFR: 94 mL/min/{1.73_m2} (ref >59)

## 2023-05-05 LAB — TSH+FREE T4
Free T4: 1.16 ng/dL (ref 0.82–1.77)
TSH: 4.96 u[IU]/mL — ABNORMAL HIGH (ref 0.450–4.500)

## 2023-05-05 LAB — LIPID PANEL
Chol/HDL Ratio: 4.4 ratio (ref 0.0–4.4)
Cholesterol, Total: 174 mg/dL (ref 100–199)
HDL: 40 mg/dL (ref >39)
LDL Chol Calc (NIH): 117 mg/dL — ABNORMAL HIGH (ref 0–99)
Triglycerides: 94 mg/dL (ref 0–149)
VLDL Cholesterol Cal: 17 mg/dL (ref 5–40)

## 2023-05-05 LAB — CBC WITH DIFFERENTIAL/PLATELET
Basophils Absolute: 0.1 10*3/uL (ref 0.0–0.2)
Basos: 1 %
EOS (ABSOLUTE): 0.2 10*3/uL (ref 0.0–0.4)
Eos: 3 %
Hematocrit: 33.2 % — ABNORMAL LOW (ref 34.0–46.6)
Hemoglobin: 10.5 g/dL — ABNORMAL LOW (ref 11.1–15.9)
Immature Grans (Abs): 0 10*3/uL (ref 0.0–0.1)
Immature Granulocytes: 0 %
Lymphocytes Absolute: 3 10*3/uL (ref 0.7–3.1)
Lymphs: 36 %
MCH: 26.6 pg (ref 26.6–33.0)
MCHC: 31.6 g/dL (ref 31.5–35.7)
MCV: 84 fL (ref 79–97)
Monocytes Absolute: 0.8 10*3/uL (ref 0.1–0.9)
Monocytes: 9 %
Neutrophils Absolute: 4.2 10*3/uL (ref 1.4–7.0)
Neutrophils: 51 %
Platelets: 284 10*3/uL (ref 150–450)
RBC: 3.95 x10E6/uL (ref 3.77–5.28)
RDW: 14.9 % (ref 11.7–15.4)
WBC: 8.3 10*3/uL (ref 3.4–10.8)

## 2023-05-05 LAB — VITAMIN D 25 HYDROXY (VIT D DEFICIENCY, FRACTURES): Vit D, 25-Hydroxy: 27.6 ng/mL — ABNORMAL LOW (ref 30.0–100.0)

## 2023-05-05 LAB — B12 AND FOLATE PANEL
Folate: 4.8 ng/mL (ref >3.0)
Vitamin B-12: 267 pg/mL (ref 232–1245)

## 2023-05-09 ENCOUNTER — Ambulatory Visit: Payer: Medicare Other | Admitting: Nurse Practitioner

## 2023-05-19 ENCOUNTER — Ambulatory Visit (INDEPENDENT_AMBULATORY_CARE_PROVIDER_SITE_OTHER): Admitting: Nurse Practitioner

## 2023-05-19 ENCOUNTER — Encounter: Payer: Self-pay | Admitting: Nurse Practitioner

## 2023-05-19 VITALS — BP 139/66 | HR 64 | Temp 98.6°F | Resp 16 | Ht 62.0 in | Wt 201.8 lb

## 2023-05-19 DIAGNOSIS — K219 Gastro-esophageal reflux disease without esophagitis: Secondary | ICD-10-CM

## 2023-05-19 DIAGNOSIS — E038 Other specified hypothyroidism: Secondary | ICD-10-CM

## 2023-05-19 DIAGNOSIS — E782 Mixed hyperlipidemia: Secondary | ICD-10-CM

## 2023-05-19 DIAGNOSIS — D513 Other dietary vitamin B12 deficiency anemia: Secondary | ICD-10-CM | POA: Diagnosis not present

## 2023-05-19 DIAGNOSIS — Z0000 Encounter for general adult medical examination without abnormal findings: Secondary | ICD-10-CM | POA: Diagnosis not present

## 2023-05-19 DIAGNOSIS — E538 Deficiency of other specified B group vitamins: Secondary | ICD-10-CM

## 2023-05-19 MED ORDER — CYANOCOBALAMIN 1000 MCG/ML IJ SOLN
1000.0000 ug | INTRAMUSCULAR | 1 refills | Status: AC
Start: 2023-05-19 — End: ?

## 2023-05-19 MED ORDER — CYANOCOBALAMIN 1000 MCG/ML IJ SOLN: 1000.0000 ug | INTRAMUSCULAR | 6 refills | Status: DC

## 2023-05-19 MED ORDER — CYANOCOBALAMIN 1000 MCG/ML IJ SOLN
1000.0000 ug | Freq: Once | INTRAMUSCULAR | Status: AC
  Administered 2023-05-19: 1000 ug via INTRAMUSCULAR

## 2023-05-19 MED ORDER — LUER LOCK SAFETY SYRINGES 25G X 5/8" 3 ML MISC
6 refills | Status: AC
Start: 2023-05-19 — End: ?

## 2023-05-19 NOTE — Progress Notes (Signed)
 [facility] [address] [place], [place] [address]  Internal MEDICINE  Office Visit Note  Patient Name: Alexis Mcpherson  [id number]  [id number]  Date of Service: 05/19/2023  Chief Complaint  Patient presents with   Medicare Wellness    HPI [name] presents for a medicare annual wellness visit.  Well-appearing 73 y.o. female with osteoarthritis, GERD, high cholesterol, B12 deficiency, and vitamin D  deficiency, and subclinical hypothyroidism.  Routine CRC screening: declined  Routine mammogram: plan to repeat next year  DEXA scan: plan to repeat next year  Labs: recent lab results reviewed and discussed with the patient.  New or worsening pain:none  Other concerns: Acid reflux -- prefers not to take medication  Skin hyperpigmentation on face, no pain, burning or intching, using OTC hydrocortisone cream.  Slightly elevated TSH at 4.9. patient not willing to take any medication so will continue to monitor this.  Low B12 and mild anemia. -- patient is vegetarian and this low B12 is an ongoing issue due to lack of adequate B12 in her diet.      05/19/2023    3:09 PM 05/04/2022   11:08 AM 04/28/2021   11:26 AM  MMSE - Mini Mental State Exam  Orientation to time 5 5 5   Orientation to Place 5 5 5   Registration 3 3 3   Attention/ Calculation 5 5 5   Recall 3 3 3   Language- name 2 objects 2 2 2   Language- repeat 1 1 1   Language- follow 3 step command 3 3 3   Language- read & follow direction 1 1 1   Write a sentence 1 1 1   Copy design 1 1 1   Total score 30 30 30     Functional Status Survey: Is the patient deaf or have difficulty hearing?: No Does the patient have difficulty seeing, even when wearing glasses/contacts?: No Does the patient have difficulty concentrating, remembering, or making decisions?: No Does the patient have difficulty walking or climbing stairs?: No Does the patient have difficulty dressing or bathing?: No Does the patient have difficulty doing  errands alone such as visiting a doctor's office or shopping?: No     11/15/2019   10:30 AM 03/17/2021    3:27 PM 04/28/2021   11:23 AM 05/04/2022   11:06 AM 05/19/2023    3:08 PM  Fall Risk  Falls in the past year? 0 0 0 0 0  Was there an injury with Fall?    0 0  Fall Risk Category Calculator    0 0  (RETIRED) Patient Fall Risk Level  Low fall risk Low fall risk    Patient at Risk for Falls Due to No Fall Risks No Fall Risks No Fall Risks No Fall Risks No Fall Risks  Fall risk Follow up Falls evaluation completed Falls evaluation completed Falls evaluation completed Falls evaluation completed Falls evaluation completed       05/19/2023    3:08 PM  Depression screen PHQ 2/9  Decreased Interest 0  Down, Depressed, Hopeless 0  PHQ - 2 Score 0       Current Medication: Outpatient Encounter Medications as of 05/19/2023  Medication Sig   mupirocin  ointment (BACTROBAN ) 2 % Apply 1 application. topically 2 (two) times daily. To affected area on forehead and temple until healed.   predniSONE  (DELTASONE ) 50 MG tablet Take 1 tablet (50 mg total) by mouth daily with breakfast.   [DISCONTINUED] cyanocobalamin  (VITAMIN B12) 1000 MCG/ML injection Inject 1 mL (1,000 mcg total) into the muscle every 30 (  thirty) days.   [DISCONTINUED] SYRINGE-NEEDLE, DISP, 3 ML (LUER LOCK SAFETY SYRINGES) 25G X 5/8" 3 ML MISC Use as directed for B12 once a month   cyanocobalamin  (VITAMIN B12) 1000 MCG/ML injection Inject 1 mL (1,000 mcg total) into the muscle every 30 (thirty) days.   SYRINGE-NEEDLE, DISP, 3 ML (LUER LOCK SAFETY SYRINGES) 25G X 5/8" 3 ML MISC Use as directed for B12 once a month   [DISCONTINUED] cyanocobalamin  (VITAMIN B12) 1000 MCG/ML injection Inject 1 mL (1,000 mcg total) into the muscle every 30 (thirty) days.   No facility-administered encounter medications on file as of 05/19/2023.    Surgical History: Past Surgical History:  Procedure Laterality Date   none     REPLACEMENT TOTAL KNEE  BILATERAL  02/14/2021    Medical History: Past Medical History:  Diagnosis Date   Known health problems: none     Family History: Family History  Problem Relation Age of Onset   Ulcers Mother     Social History   Socioeconomic History   Marital status: Married    Spouse name: Not on file   Number of children: Not on file   Years of education: Not on file   Highest education level: Not on file  Occupational History   Not on file  Tobacco Use   Smoking status: Never   Smokeless tobacco: Never  Substance and Sexual Activity   Alcohol use: Not Currently   Drug use: Not Currently   Sexual activity: Not on file  Other Topics Concern   Not on file  Social History Narrative   Not on file   Social Drivers of Health   Financial Resource Strain: Not on file  Food Insecurity: Not on file  Transportation Needs: Not on file  Physical Activity: Not on file  Stress: Not on file  Social Connections: Not on file  Intimate Partner Violence: Not on file      Review of Systems  Constitutional:  Positive for fatigue. Negative for chills and unexpected weight change.  HENT:  Negative for congestion, rhinorrhea, sneezing and sore throat.   Eyes:  Negative for redness.  Respiratory: Negative.  Negative for cough, chest tightness, shortness of breath and wheezing.   Cardiovascular: Negative.  Negative for chest pain and palpitations.  Gastrointestinal:  Negative for abdominal pain, constipation, diarrhea, nausea and vomiting.  Genitourinary:  Negative for dysuria and frequency.  Musculoskeletal:  Positive for arthralgias. Negative for back pain, joint swelling and neck pain.  Skin:  Negative for rash.  Neurological: Negative.  Negative for tremors and numbness.  Hematological:  Negative for adenopathy. Does not bruise/bleed easily.  Psychiatric/Behavioral:  Negative for behavioral problems (Depression), sleep disturbance and suicidal ideas. The patient is not nervous/anxious.      Vital Signs: BP 139/66   Pulse 64   Temp 98.6 F (37 C)   Resp 16   Ht 5\' 2"  (1.575 m)   Wt 201 lb 12.8 oz (91.5 kg)   SpO2 97%   BMI 36.91 kg/m    Physical Exam Vitals reviewed.  Constitutional:      General: She is awake. She is not in acute distress.    Appearance: Normal appearance. She is well-developed and well-groomed. She is obese. She is not ill-appearing or diaphoretic.  HENT:     Head: Normocephalic and atraumatic.     Right Ear: Tympanic membrane, ear canal and external ear normal.     Left Ear: Tympanic membrane, ear canal and external ear normal.  Nose: Nose normal. No congestion or rhinorrhea.     Mouth/Throat:     Lips: Pink.     Mouth: Mucous membranes are moist.     Pharynx: Oropharynx is clear. Uvula midline. No oropharyngeal exudate or posterior oropharyngeal erythema.  Eyes:     General: Lids are normal. Vision grossly intact. Gaze aligned appropriately. No scleral icterus.       Right eye: No discharge.        Left eye: No discharge.     Extraocular Movements: Extraocular movements intact.     Conjunctiva/sclera: Conjunctivae normal.     Pupils: Pupils are equal, round, and reactive to light.  Neck:     Thyroid: No thyromegaly.     Vascular: No JVD.     Trachea: No tracheal deviation.  Cardiovascular:     Rate and Rhythm: Normal rate and regular rhythm.     Heart sounds: Normal heart sounds, S1 normal and S2 normal. No murmur heard.    No friction rub. No gallop.  Pulmonary:     Effort: Pulmonary effort is normal. No accessory muscle usage or respiratory distress.     Breath sounds: Normal breath sounds and air entry. No stridor. No wheezing or rales.  Chest:     Chest wall: No tenderness.     Comments: Declined clinical breast exam Abdominal:     General: Bowel sounds are normal. There is no distension.     Palpations: Abdomen is soft. There is no mass.     Tenderness: There is no abdominal tenderness. There is no guarding or rebound.   Musculoskeletal:        General: No deformity. Normal range of motion.     Cervical back: Normal range of motion and neck supple.     Right lower leg: 1+ Pitting Edema present.     Left lower leg: 1+ Pitting Edema present.     Right ankle: Swelling present. Tenderness present.     Left ankle: Swelling present. Tenderness present.  Lymphadenopathy:     Cervical: No cervical adenopathy.  Skin:    General: Skin is warm and dry.     Capillary Refill: Capillary refill takes less than 2 seconds.     Coloration: Skin is not pale.     Findings: No erythema or rash.  Neurological:     Mental Status: She is alert and oriented to person, place, and time.     Cranial Nerves: No cranial nerve deficit.     Motor: No abnormal muscle tone.     Coordination: Coordination normal.     Deep Tendon Reflexes: Reflexes are normal and symmetric.  Psychiatric:        Mood and Affect: Mood normal.        Behavior: Behavior normal. Behavior is cooperative.        Thought Content: Thought content normal.        Judgment: Judgment normal.        Assessment/Plan: 1. Encounter for subsequent annual wellness visit (AWV) in Medicare patient (Primary) Age-appropriate preventive screenings and vaccinations discussed. Routine labs for health maintenance results discussed with patient and her daughter. PHM updated.    2. Subclinical hypothyroidism Declined any medication at this time, will repeat labs in 6 months   3. Other dietary vitamin B12 deficiency anemia B12 injection administered today and IM B12 prescribed for patient to receive subsequent injections at home.  - SYRINGE-NEEDLE, DISP, 3 ML (LUER LOCK SAFETY SYRINGES) 25G X 5/8" 3 ML  MISC; Use as directed for B12 once a month  Dispense: 1 each; Refill: 6 - cyanocobalamin  (VITAMIN B12) 1000 MCG/ML injection; Inject 1 mL (1,000 mcg total) into the muscle every 30 (thirty) days.  Dispense: 3 mL; Refill: 1 - cyanocobalamin  (VITAMIN B12) injection 1,000  mcg  4. Gastroesophageal reflux disease without esophagitis List of home remedies given to patient.   5. Mixed hyperlipidemia Incorporate lean proteins in diet.       General Counseling: Dakari verbalizes understanding of the findings of todays visit and agrees with plan of treatment. I have discussed any further diagnostic evaluation that may be needed or ordered today. We also reviewed her medications today. she has been encouraged to call the office with any questions or concerns that should arise related to todays visit.    No orders of the defined types were placed in this encounter.   Meds ordered this encounter  Medications   DISCONTD: cyanocobalamin  (VITAMIN B12) 1000 MCG/ML injection    Sig: Inject 1 mL (1,000 mcg total) into the muscle every 30 (thirty) days.    Dispense:  1 mL    Refill:  6   SYRINGE-NEEDLE, DISP, 3 ML (LUER LOCK SAFETY SYRINGES) 25G X 5/8" 3 ML MISC    Sig: Use as directed for B12 once a month    Dispense:  1 each    Refill:  6   cyanocobalamin  (VITAMIN B12) 1000 MCG/ML injection    Sig: Inject 1 mL (1,000 mcg total) into the muscle every 30 (thirty) days.    Dispense:  3 mL    Refill:  1    Fill new script, discontinue previous orders    Return in about 6 months (around 11/19/2023) for F/U, Sharnette Kitamura PCP, Labs.   Total time spent:30 Minutes Time spent includes review of chart, medications, test results, and follow up plan with the patient.   [place] Controlled Substance Database was reviewed by me.  This patient was seen by Laurence Pons, FNP-C in collaboration with Dr. Verneta Gone as a part of collaborative care agreement.  Jadin Creque R. Bobbi Burow, MSN, FNP-C Internal medicine

## 2023-05-20 ENCOUNTER — Encounter: Payer: Self-pay | Admitting: Nurse Practitioner

## 2023-05-20 DIAGNOSIS — E782 Mixed hyperlipidemia: Secondary | ICD-10-CM | POA: Insufficient documentation

## 2023-05-20 DIAGNOSIS — K219 Gastro-esophageal reflux disease without esophagitis: Secondary | ICD-10-CM | POA: Insufficient documentation

## 2023-05-20 DIAGNOSIS — E038 Other specified hypothyroidism: Secondary | ICD-10-CM | POA: Insufficient documentation

## 2023-05-20 DIAGNOSIS — E538 Deficiency of other specified B group vitamins: Secondary | ICD-10-CM | POA: Insufficient documentation

## 2023-05-20 DIAGNOSIS — D513 Other dietary vitamin B12 deficiency anemia: Secondary | ICD-10-CM | POA: Insufficient documentation

## 2023-11-22 ENCOUNTER — Ambulatory Visit (INDEPENDENT_AMBULATORY_CARE_PROVIDER_SITE_OTHER): Admitting: Nurse Practitioner

## 2023-11-22 ENCOUNTER — Encounter: Payer: Self-pay | Admitting: Nurse Practitioner

## 2023-11-22 VITALS — BP 135/70 | HR 70 | Temp 98.1°F | Resp 16 | Ht 62.0 in | Wt 202.2 lb

## 2023-11-22 DIAGNOSIS — D513 Other dietary vitamin B12 deficiency anemia: Secondary | ICD-10-CM | POA: Diagnosis not present

## 2023-11-22 DIAGNOSIS — E038 Other specified hypothyroidism: Secondary | ICD-10-CM | POA: Diagnosis not present

## 2023-11-22 DIAGNOSIS — R7301 Impaired fasting glucose: Secondary | ICD-10-CM

## 2023-11-22 DIAGNOSIS — Z23 Encounter for immunization: Secondary | ICD-10-CM

## 2023-11-22 DIAGNOSIS — M545 Low back pain, unspecified: Secondary | ICD-10-CM | POA: Diagnosis not present

## 2023-11-22 DIAGNOSIS — E559 Vitamin D deficiency, unspecified: Secondary | ICD-10-CM

## 2023-11-22 DIAGNOSIS — K219 Gastro-esophageal reflux disease without esophagitis: Secondary | ICD-10-CM

## 2023-11-22 DIAGNOSIS — G8929 Other chronic pain: Secondary | ICD-10-CM

## 2023-11-22 MED ORDER — PNEUMOCOCCAL 20-VAL CONJ VACC 0.5 ML IM SUSY: 0.5000 mL | PREFILLED_SYRINGE | Freq: Once | INTRAMUSCULAR | 0 refills | Status: AC | PRN

## 2023-11-22 NOTE — Progress Notes (Signed)
 [facility] [address] [place], [place] [address]  Internal MEDICINE  Office Visit Note  Patient Name: Alexis Mcpherson  [id number]  [id number]  Date of Service: 11/22/2023  Chief Complaint  Patient presents with   Follow-up    HPI Alexis Mcpherson presents for a follow-up visit for Low B12 -- injections cost too much to do at home.  Low vitamin D  -- takes weekly supplement.  High cholesterol -- last lipid panel was normal except slightly elevated LDL of 117.  Slightly elevated glucose -- need to screen for diabetes  Mild anemia -- last hgb was 10.5 Slightly elevated TSH in April  Due for pneumonia vaccine.  GERD -- takes OTC medication sometimes but ok to take daily.  Low back pain/ pinched nerve-- bothering her off and on for 2-3 years. Wants a referral.   Current Medication: Outpatient Encounter Medications as of 11/22/2023  Medication Sig   pneumococcal 20-valent conjugate vaccine (PREVNAR 20) 0.5 ML injection Inject 0.5 mLs into the muscle once as needed for up to 1 dose.   cyanocobalamin  (VITAMIN B12) 1000 MCG/ML injection Inject 1 mL (1,000 mcg total) into the muscle every 30 (thirty) days.   mupirocin  ointment (BACTROBAN ) 2 % Apply 1 application. topically 2 (two) times daily. To affected area on forehead and temple until healed.   predniSONE  (DELTASONE ) 50 MG tablet Take 1 tablet (50 mg total) by mouth daily with breakfast.   SYRINGE-NEEDLE, DISP, 3 ML (LUER LOCK SAFETY SYRINGES) 25G X 5/8 3 ML MISC Use as directed for B12 once a month   No facility-administered encounter medications on file as of 11/22/2023.    Surgical History: Past Surgical History:  Procedure Laterality Date   none     REPLACEMENT TOTAL KNEE BILATERAL  02/14/2021    Medical History: Past Medical History:  Diagnosis Date   Known health problems: none     Family History: Family History  Problem Relation Age of Onset   Ulcers Mother     Social History   Socioeconomic History    Marital status: Married    Spouse name: Not on file   Number of children: Not on file   Years of education: Not on file   Highest education level: Not on file  Occupational History   Not on file  Tobacco Use   Smoking status: Never   Smokeless tobacco: Never  Substance and Sexual Activity   Alcohol use: Not Currently   Drug use: Not Currently   Sexual activity: Not on file  Other Topics Concern   Not on file  Social History Narrative   Not on file   Social Drivers of Health   Financial Resource Strain: Not on file  Food Insecurity: Not on file  Transportation Needs: Not on file  Physical Activity: Not on file  Stress: Not on file  Social Connections: Not on file  Intimate Partner Violence: Not on file      Review of Systems  Constitutional:  Positive for fatigue. Negative for chills and diaphoresis.  HENT: Negative.  Negative for ear pain, postnasal drip and sinus pressure.   Eyes:  Negative for photophobia, discharge, redness, itching and visual disturbance.  Respiratory: Negative.  Negative for cough, chest tightness, shortness of breath and wheezing.   Cardiovascular: Negative.  Negative for chest pain, palpitations and leg swelling.  Gastrointestinal: Negative.  Negative for abdominal pain, constipation, diarrhea, nausea and vomiting.  Genitourinary:  Negative for dysuria and flank pain.  Musculoskeletal:  Positive for arthralgias,  back pain, gait problem and joint swelling. Negative for neck pain.  Skin:  Negative for color change.  Allergic/Immunologic: Negative for environmental allergies and food allergies.  Neurological:  Negative for dizziness and headaches.  Hematological:  Does not bruise/bleed easily.  Psychiatric/Behavioral:  Negative for agitation, behavioral problems (depression) and hallucinations.     Vital Signs: BP 135/70 Comment: 144/69  Pulse 70   Temp 98.1 F (36.7 C)   Resp 16   Ht 5' 2 (1.575 m)   Wt 202 lb 3.2 oz (91.7 kg)   SpO2 96%    BMI 36.98 kg/m    Physical Exam Vitals reviewed.  Constitutional:      General: She is not in acute distress.    Appearance: Normal appearance. She is obese. She is not ill-appearing.  HENT:     Head: Normocephalic and atraumatic.  Eyes:     Pupils: Pupils are equal, round, and reactive to light.  Cardiovascular:     Rate and Rhythm: Normal rate and regular rhythm.  Pulmonary:     Effort: Pulmonary effort is normal. No respiratory distress.  Neurological:     Mental Status: She is alert and oriented to person, place, and time.  Psychiatric:        Mood and Affect: Mood normal.        Behavior: Behavior normal.        Assessment/Plan: 1. Subclinical hypothyroidism (Primary) Routine lab ordered  - TSH + free T4  2. Other dietary vitamin B12 deficiency anemia Routine labs ordered  - B12 and Folate Panel - CBC with Differential/Platelet  3. Impaired fasting glucose Routine lab ordered  - Hgb A1C w/o eAG  4. Chronic left-sided low back pain without sciatica Referred to orthopedic surgery.  - Ambulatory referral to Orthopedic Surgery  5. Gastroesophageal reflux disease without esophagitis Takes Otc medication as needed.   6. Vitamin D  deficiency Routine lab ordered  - Vitamin D  (25 hydroxy)  7. Need for vaccination against Streptococcus pneumoniae - pneumococcal 20-valent conjugate vaccine (PREVNAR 20) 0.5 ML injection; Inject 0.5 mLs into the muscle once as needed for up to 1 dose.  Dispense: 0.5 mL; Refill: 0   General Counseling: Astra verbalizes understanding of the findings of todays visit and agrees with plan of treatment. I have discussed any further diagnostic evaluation that may be needed or ordered today. We also reviewed her medications today. she has been encouraged to call the office with any questions or concerns that should arise related to todays visit.    Orders Placed This Encounter  Procedures   B12 and Folate Panel   Vitamin D  (25  hydroxy)   TSH + free T4   Hgb A1C w/o eAG   CBC with Differential/Platelet   Ambulatory referral to Orthopedic Surgery    Meds ordered this encounter  Medications   pneumococcal 20-valent conjugate vaccine (PREVNAR 20) 0.5 ML injection    Sig: Inject 0.5 mLs into the muscle once as needed for up to 1 dose.    Dispense:  0.5 mL    Refill:  0    Due for prevnar 20    Return for F/U, Labs, Danice Dippolito PCP in 1 -2 weeks. .   Total time spent:30 Minutes Time spent includes review of chart, medications, test results, and follow up plan with the patient.   [place] Controlled Substance Database was reviewed by me.  This patient was seen by Mardy Maxin, FNP-C in collaboration with Dr. Sigrid Bathe as a part  of collaborative care agreement.   Ky Rumple R. Liana, MSN, FNP-C Internal medicine

## 2023-11-23 ENCOUNTER — Telehealth: Payer: Self-pay | Admitting: Nurse Practitioner

## 2023-11-23 ENCOUNTER — Ambulatory Visit: Payer: Self-pay | Admitting: Nurse Practitioner

## 2023-11-23 LAB — TSH+FREE T4
Free T4: 1.32 ng/dL (ref 0.82–1.77)
TSH: 2.83 u[IU]/mL (ref 0.450–4.500)

## 2023-11-23 LAB — CBC WITH DIFFERENTIAL/PLATELET
Basophils Absolute: 0.1 x10E3/uL (ref 0.0–0.2)
Basos: 1 %
EOS (ABSOLUTE): 0.3 x10E3/uL (ref 0.0–0.4)
Eos: 3 %
Hematocrit: 35.1 % (ref 34.0–46.6)
Hemoglobin: 11.3 g/dL (ref 11.1–15.9)
Immature Grans (Abs): 0 x10E3/uL (ref 0.0–0.1)
Immature Granulocytes: 0 %
Lymphocytes Absolute: 3.3 x10E3/uL — ABNORMAL HIGH (ref 0.7–3.1)
Lymphs: 36 %
MCH: 28.8 pg (ref 26.6–33.0)
MCHC: 32.2 g/dL (ref 31.5–35.7)
MCV: 89 fL (ref 79–97)
Monocytes Absolute: 0.9 x10E3/uL (ref 0.1–0.9)
Monocytes: 9 %
Neutrophils Absolute: 4.8 x10E3/uL (ref 1.4–7.0)
Neutrophils: 51 %
Platelets: 283 x10E3/uL (ref 150–450)
RBC: 3.93 x10E6/uL (ref 3.77–5.28)
RDW: 13.2 % (ref 11.7–15.4)
WBC: 9.3 x10E3/uL (ref 3.4–10.8)

## 2023-11-23 LAB — HGB A1C W/O EAG: Hgb A1c MFr Bld: 6.3 % — ABNORMAL HIGH (ref 4.8–5.6)

## 2023-11-23 LAB — B12 AND FOLATE PANEL
Folate: 6.4 ng/mL (ref >3.0)
Vitamin B-12: 437 pg/mL (ref 232–1245)

## 2023-11-23 LAB — VITAMIN D 25 HYDROXY (VIT D DEFICIENCY, FRACTURES): Vit D, 25-Hydroxy: 40.8 ng/mL (ref 30.0–100.0)

## 2023-11-23 NOTE — Telephone Encounter (Signed)
 Awaiting 11/22/23 office notes for Orthopedic referral-Toni

## 2023-11-23 NOTE — Progress Notes (Signed)
 Lab results reviewed, we will discuss the results at her appt next week

## 2023-11-29 ENCOUNTER — Encounter: Payer: Self-pay | Admitting: Nurse Practitioner

## 2023-11-29 ENCOUNTER — Ambulatory Visit: Admitting: Nurse Practitioner

## 2023-11-29 VITALS — BP 138/80 | HR 74 | Temp 97.5°F | Resp 16 | Ht 62.0 in | Wt 203.6 lb

## 2023-11-29 DIAGNOSIS — R7303 Prediabetes: Secondary | ICD-10-CM | POA: Diagnosis not present

## 2023-11-29 DIAGNOSIS — D513 Other dietary vitamin B12 deficiency anemia: Secondary | ICD-10-CM | POA: Diagnosis not present

## 2023-11-29 DIAGNOSIS — E559 Vitamin D deficiency, unspecified: Secondary | ICD-10-CM | POA: Diagnosis not present

## 2023-11-29 NOTE — Progress Notes (Signed)
 [facility] [address] [place], [place] [address]  Internal MEDICINE  Office Visit Note  Patient Name: Alexis Mcpherson  [id number]  [id number]  Date of Service: 11/29/2023  Chief Complaint  Patient presents with   Follow-up    Review labs    HPI [name] presents for a follow-up visit for lab results  Anemia has resolved on most recent labs A1c is prediabetic and slightly increased at 6.3 -- per patient report, she eats a lot of rice every week. TSH and free T4 are normal  B12 and folate are improved Vitamin D  level is in normal range.     Current Medication: Outpatient Encounter Medications as of 11/29/2023  Medication Sig   cyanocobalamin  (VITAMIN B12) 1000 MCG/ML injection Inject 1 mL (1,000 mcg total) into the muscle every 30 (thirty) days.   mupirocin  ointment (BACTROBAN ) 2 % Apply 1 application. topically 2 (two) times daily. To affected area on forehead and temple until healed.   pneumococcal 20-valent conjugate vaccine (PREVNAR 20) 0.5 ML injection Inject 0.5 mLs into the muscle once as needed for up to 1 dose.   predniSONE  (DELTASONE ) 50 MG tablet Take 1 tablet (50 mg total) by mouth daily with breakfast.   SYRINGE-NEEDLE, DISP, 3 ML (LUER LOCK SAFETY SYRINGES) 25G X 5/8 3 ML MISC Use as directed for B12 once a month   No facility-administered encounter medications on file as of 11/29/2023.    Surgical History: Past Surgical History:  Procedure Laterality Date   none     REPLACEMENT TOTAL KNEE BILATERAL  02/14/2021    Medical History: Past Medical History:  Diagnosis Date   Known health problems: none     Family History: Family History  Problem Relation Age of Onset   Ulcers Mother     Social History   Socioeconomic History   Marital status: Married    Spouse name: Not on file   Number of children: Not on file   Years of education: Not on file   Highest education level: Not on file  Occupational History   Not on file  Tobacco  Use   Smoking status: Never   Smokeless tobacco: Never  Substance and Sexual Activity   Alcohol use: Not Currently   Drug use: Not Currently   Sexual activity: Not on file  Other Topics Concern   Not on file  Social History Narrative   Not on file   Social Drivers of Health   Financial Resource Strain: Not on file  Food Insecurity: Not on file  Transportation Needs: Not on file  Physical Activity: Not on file  Stress: Not on file  Social Connections: Not on file  Intimate Partner Violence: Not on file      Review of Systems  Constitutional:  Positive for fatigue. Negative for chills and diaphoresis.  HENT: Negative.  Negative for ear pain, postnasal drip and sinus pressure.   Eyes:  Negative for photophobia, discharge, redness, itching and visual disturbance.  Respiratory: Negative.  Negative for cough, chest tightness, shortness of breath and wheezing.   Cardiovascular: Negative.  Negative for chest pain, palpitations and leg swelling.  Gastrointestinal: Negative.  Negative for abdominal pain, constipation, diarrhea, nausea and vomiting.  Genitourinary:  Negative for dysuria and flank pain.  Musculoskeletal:  Positive for arthralgias, back pain, gait problem and joint swelling. Negative for neck pain.  Skin:  Negative for color change.  Allergic/Immunologic: Negative for environmental allergies and food allergies.  Neurological:  Negative for dizziness and headaches.  Hematological:  Does not bruise/bleed easily.  Psychiatric/Behavioral:  Negative for agitation, behavioral problems (depression) and hallucinations.     Vital Signs: BP 138/80 Comment: 150/87  Pulse 74   Temp (!) 97.5 F (36.4 C)   Resp 16   Ht 5' 2 (1.575 m)   Wt 203 lb 9.6 oz (92.4 kg)   SpO2 99%   BMI 37.24 kg/m    Physical Exam Vitals reviewed.  Constitutional:      General: She is not in acute distress.    Appearance: Normal appearance. She is obese. She is not ill-appearing.  HENT:      Head: Normocephalic and atraumatic.  Eyes:     Pupils: Pupils are equal, round, and reactive to light.  Cardiovascular:     Rate and Rhythm: Normal rate and regular rhythm.  Pulmonary:     Effort: Pulmonary effort is normal. No respiratory distress.  Neurological:     Mental Status: She is alert and oriented to person, place, and time.  Psychiatric:        Mood and Affect: Mood normal.        Behavior: Behavior normal.        Assessment/Plan: 1. Prediabetes (Primary) Continue low carb, low sugar diet, decrease servings of rice per week   2. Other dietary vitamin B12 deficiency anemia Continue vitamin B12 supplement   3. Vitamin D  deficiency Continue OTC vitamin D  supplement    General Counseling: Simi verbalizes understanding of the findings of todays visit and agrees with plan of treatment. I have discussed any further diagnostic evaluation that may be needed or ordered today. We also reviewed her medications today. she has been encouraged to call the office with any questions or concerns that should arise related to todays visit.    No orders of the defined types were placed in this encounter.   No orders of the defined types were placed in this encounter.   Return in about 6 months (around 05/28/2024) for F/U, Berton Butrick PCP.   Total time spent:30 Minutes Time spent includes review of chart, medications, test results, and follow up plan with the patient.   [place] Controlled Substance Database was reviewed by me.  This patient was seen by Mardy Maxin, FNP-C in collaboration with Dr. Sigrid Bathe as a part of collaborative care agreement.   Josy Peaden R. Maxin, MSN, FNP-C Internal medicine

## 2023-12-05 ENCOUNTER — Encounter: Payer: Self-pay | Admitting: Nurse Practitioner

## 2023-12-07 ENCOUNTER — Telehealth: Payer: Self-pay | Admitting: Nurse Practitioner

## 2023-12-07 NOTE — Telephone Encounter (Signed)
 Orthopedic referral faxed to [facility] per patient request; [phone].  Notified daughter-in-law. Gave her telephone [phone]

## 2024-05-21 ENCOUNTER — Ambulatory Visit: Admitting: Nurse Practitioner

## 2024-05-29 ENCOUNTER — Ambulatory Visit: Admitting: Nurse Practitioner
